# Patient Record
Sex: Male | Born: 1965 | Race: White | Hispanic: No | Marital: Married | Smoking: Never smoker
Health system: Southern US, Community
[De-identification: ages and names within clinical notes are randomized; demographics above are authoritative.]

## PROBLEM LIST (undated history)

## (undated) DIAGNOSIS — Z9109 Other allergy status, other than to drugs and biological substances: Secondary | ICD-10-CM

## (undated) HISTORY — PX: TONSILLECTOMY: SUR1361

## (undated) HISTORY — PX: HERNIA REPAIR: SHX51

## (undated) HISTORY — PX: SPLENECTOMY, TOTAL: SHX788

---

## 2011-04-14 ENCOUNTER — Emergency Department
Admission: EM | Admit: 2011-04-14 | Discharge: 2011-04-14 | Disposition: A | Discharge status: Home / Self Care | Payer: BC Managed Care – PPO | Source: Home / Self Care

## 2011-04-14 DIAGNOSIS — J45909 Unspecified asthma, uncomplicated: Secondary | ICD-10-CM

## 2011-04-14 DIAGNOSIS — R05 Cough: Secondary | ICD-10-CM

## 2011-04-14 DIAGNOSIS — R062 Wheezing: Secondary | ICD-10-CM

## 2011-04-14 DIAGNOSIS — R059 Cough, unspecified: Secondary | ICD-10-CM

## 2011-04-14 HISTORY — DX: Other allergy status, other than to drugs and biological substances: Z91.09

## 2011-04-14 MED ORDER — METHYLPREDNISOLONE ACETATE PF 80 MG/ML IJ SUSP
80.0000 mg | Freq: Once | INTRAMUSCULAR | Status: AC
  Administered 2011-04-14: 80 mg via INTRAMUSCULAR

## 2011-04-14 MED ORDER — PREDNISONE 50 MG PO TABS: ORAL_TABLET | ORAL | Status: AC

## 2011-04-14 MED ORDER — IPRATROPIUM-ALBUTEROL 0.5-2.5 (3) MG/3ML IN SOLN
3.0000 mL | Freq: Once | RESPIRATORY_TRACT | Status: AC
  Administered 2011-04-14: 3 mL via RESPIRATORY_TRACT

## 2011-04-14 MED ORDER — FAMOTIDINE 20 MG PO TABS: 20.0000 mg | ORAL_TABLET | Freq: Two times a day (BID) | ORAL | Status: DC

## 2011-04-14 MED ORDER — ALBUTEROL SULFATE HFA 108 (90 BASE) MCG/ACT IN AERS: 2.0000 | INHALATION_SPRAY | Freq: Four times a day (QID) | RESPIRATORY_TRACT | Status: DC | PRN

## 2011-04-14 NOTE — Discharge Instructions (Signed)
Asthma, Adult Asthma is caused by narrowing of the air passages in the lungs. It may be triggered by pollen, dust, animal dander, molds, some foods, respiratory infections, exposure to smoke, exercise, emotional stress or other allergens (things that cause allergic reactions or allergies). Repeat attacks are common. HOME CARE INSTRUCTIONS   Use prescription medications as ordered by your caregiver.   Avoid pollen, dust, animal dander, molds, smoke and other things that cause attacks at home and at work.   You may have fewer attacks if you decrease dust in your home. Electrostatic air cleaners may help.   It may help to replace your pillows or mattress with materials less likely to cause allergies.   Talk to your caregiver about an action plan for managing asthma attacks at home, including, the use of a peak flow meter which measures the severity of your asthma attack. An action plan can help minimize or stop the attack without having to seek medical care.   If you are not on a fluid restriction, drink 8 to 10 glasses of water each day.   Always have a plan prepared for seeking medical attention, including, calling your physician, accessing local emergency care, and calling 911 (in the [place] for a severe attack.   Discuss possible exercise routines with your caregiver.   If animal dander is the cause of asthma, you may need to get rid of pets.  SEEK MEDICAL CARE IF:   You have wheezing and shortness of breath even if taking medicine to prevent attacks.   You have muscle aches, chest pain or thickening of sputum.   Your sputum changes from clear or white to yellow, green, gray, or bloody.   You have any problems that may be related to the medicine you are taking (such as a rash, itching, swelling or trouble breathing).  SEEK IMMEDIATE MEDICAL CARE IF:   Your usual medicines do not stop your wheezing or there is increased coughing and/or shortness of breath.   You have increased  difficulty breathing.   You have a fever.  MAKE SURE YOU:   Understand these instructions.   Will watch your condition.   Will get help right away if you are not doing well or get worse.  Document Released: 01/22/2005 Document Revised: 01/11/2011 Document Reviewed: 09/10/2007 [facility] Patient Information 2012 [place], [place].  Allergies, Generic Allergies may happen from anything your body is sensitive to. This may be food, medicines, pollens, chemicals, and nearly anything around you in everyday life that produces allergens. An allergen is anything that causes an allergy producing substance. Heredity is often a factor in causing these problems. This means you may have some of the same allergies as your parents. Food allergies happen in all age groups. Food allergies are some of the most severe and life threatening. Some common food allergies are cow's milk, seafood, eggs, nuts, wheat, and soybeans. SYMPTOMS   Swelling around the mouth.   An itchy red rash or hives.   Vomiting or diarrhea.   Difficulty breathing.  SEVERE ALLERGIC REACTIONS ARE LIFE-THREATENING. This reaction is called anaphylaxis. It can cause the mouth and throat to swell and cause difficulty with breathing and swallowing. In severe reactions only a trace amount of food (for example, peanut oil in a salad) may cause death within seconds. Seasonal allergies occur in all age groups. These are seasonal because they usually occur during the same season every year. They may be a reaction to molds, grass pollens, or tree pollens. Other causes  of problems are house dust mite allergens, pet dander, and mold spores. The symptoms often consist of nasal congestion, a runny itchy nose associated with sneezing, and tearing itchy eyes. There is often an associated itching of the mouth and ears. The problems happen when you come in contact with pollens and other allergens. Allergens are the particles in the air that the body reacts to  with an allergic reaction. This causes you to release allergic antibodies. Through a chain of events, these eventually cause you to release histamine into the blood stream. Although it is meant to be protective to the body, it is this release that causes your discomfort. This is why you were given anti-histamines to feel better. If you are unable to pinpoint the offending allergen, it may be determined by skin or blood testing. Allergies cannot be cured but can be controlled with medicine. Hay fever is a collection of all or some of the seasonal allergy problems. It may often be treated with simple over-the-counter medicine such as diphenhydramine. Take medicine as directed. Do not drink alcohol or drive while taking this medicine. Check with your caregiver or package insert for child dosages. If these medicines are not effective, there are many new medicines your caregiver can prescribe. Stronger medicine such as nasal spray, eye drops, and corticosteroids may be used if the first things you try do not work well. Other treatments such as immunotherapy or desensitizing injections can be used if all else fails. Follow up with your caregiver if problems continue. These seasonal allergies are usually not life threatening. They are generally more of a nuisance that can often be handled using medicine. HOME CARE INSTRUCTIONS   If unsure what causes a reaction, keep a diary of foods eaten and symptoms that follow. Avoid foods that cause reactions.   If hives or rash are present:   Take medicine as directed.   You may use an over-the-counter antihistamine (diphenhydramine) for hives and itching as needed.   Apply cold compresses (cloths) to the skin or take baths in cool water. Avoid hot baths or showers. Heat will make a rash and itching worse.   If you are severely allergic:   Following a treatment for a severe reaction, hospitalization is often required for closer follow-up.   Wear a medic-alert  bracelet or necklace stating the allergy.   You and your family must learn how to give adrenaline or use an anaphylaxis kit.   If you have had a severe reaction, always carry your anaphylaxis kit or EpiPen with you. Use this medicine as directed by your caregiver if a severe reaction is occurring. Failure to do so could have a fatal outcome.  SEEK MEDICAL CARE IF:  You suspect a food allergy. Symptoms generally happen within 30 minutes of eating a food.   Your symptoms have not gone away within 2 days or are getting worse.   You develop new symptoms.   You want to retest yourself or your child with a food or drink you think causes an allergic reaction. Never do this if an anaphylactic reaction to that food or drink has happened before. Only do this under the care of a caregiver.  SEEK IMMEDIATE MEDICAL CARE IF:   You have difficulty breathing, are wheezing, or have a tight feeling in your chest or throat.   You have a swollen mouth, or you have hives, swelling, or itching all over your body.   You have had a severe reaction that has responded to  your anaphylaxis kit or an EpiPen. These reactions may return when the medicine has worn off. These reactions should be considered life threatening.  MAKE SURE YOU:   Understand these instructions.   Will watch your condition.   Will get help right away if you are not doing well or get worse.  Document Released: 04/17/2002 Document Revised: 01/11/2011 Document Reviewed: 09/22/2007 [facility] Patient Information 2012 [place], [place].

## 2011-04-14 NOTE — ED Provider Notes (Signed)
Subjective:    Patient ID: Don Dominguez is a 46 y.o. male.  Chief Complaint:  HPI Comments: Pt presents today with chief complaint of URI sxs including rhinorrhea, nasal congestion, cough x 5 days. Pt with a baseline history perennial allergies that he takes zyrtec and allergy shots for. Pt also with background history of mild intermittent asthma. Pt states that his allergic sxs as well as asthma sxs have worsened over this same time period. Pt reports mild increase in wheezing over this time period, though no significant increased WOB. Baseline rhinorrhea has been poorly controlled with zyrtec this week. Pt is unsure of sick contacts. Pt states that allergic and asthma sxs worsen this time of year. No fevers, nausea, vomiting. No diarrhea. + Mild headache.   No past medical history on file.  No past surgical history on file.  No family history on file.  History  Substance Use Topics  . Smoking status: Not on file  . Smokeless tobacco: Not on file  . Alcohol Use: Not on file    Review of Systems  Constitutional: Positive for fatigue. Negative for fever, chills and diaphoresis.  HENT: Positive for congestion, rhinorrhea and postnasal drip.   Respiratory: Positive for wheezing. Negative for chest tightness and shortness of breath.   Cardiovascular: Negative for chest pain.  Hematological: Negative for adenopathy.    Objective:   There were no vitals taken for this visit.  Physical Exam  Constitutional: He is oriented to person, place, and time. He appears well-developed and well-nourished.  HENT:  Head: Normocephalic and atraumatic.       +nasal erythema, rhinorrhea bilaterally, + post oropharyngeal erythema    Neck: Normal range of motion. Neck supple.  Cardiovascular: Normal rate and regular rhythm.   Pulmonary/Chest: Effort normal. He has wheezes.       + wheezes in apices bilaterally. Able to speak in full sentences.   Abdominal: Soft. Bowel sounds are normal.    Musculoskeletal: Normal range of motion.  Neurological: He is alert and oriented to person, place, and time.  Skin: Skin is warm. No rash noted. No erythema.    Assessment:   Procedures  There were no encounter diagnoses.  Plan:  Exam consistent with viral induced asthma exacerbation with overlapping allergic flare. Duoneb tx x 1. Depo medrol 80 IM x1. Albuterol inhaler refilled (pt ran out of rx). Prednisone 50 x 4 days (total 5 days of steroids). Pepcid with zyrtec for concominant antihistamine effect. Follow up with PCP next week. Respiratory red flags discussed at length.

## 2011-04-14 NOTE — ED Notes (Signed)
Cough x 5 days

## 2011-04-14 NOTE — ED Provider Notes (Signed)
Pt seen by Dr. Doroteo Bradford, MD 04/14/11 [phone]

## 2011-09-12 ENCOUNTER — Emergency Department
Admission: EM | Admit: 2011-09-12 | Discharge: 2011-09-12 | Disposition: A | Discharge status: Home / Self Care | Payer: BC Managed Care – PPO | Source: Home / Self Care | Attending: Family Medicine | Admitting: Family Medicine

## 2011-09-12 DIAGNOSIS — L309 Dermatitis, unspecified: Secondary | ICD-10-CM

## 2011-09-12 DIAGNOSIS — J45901 Unspecified asthma with (acute) exacerbation: Secondary | ICD-10-CM

## 2011-09-12 DIAGNOSIS — L259 Unspecified contact dermatitis, unspecified cause: Secondary | ICD-10-CM

## 2011-09-12 MED ORDER — GUAIFENESIN-CODEINE 100-10 MG/5ML PO SYRP: 10.0000 mL | ORAL_SOLUTION | Freq: Every day | ORAL | Status: AC

## 2011-09-12 MED ORDER — CEPHALEXIN 500 MG PO CAPS: 500.0000 mg | ORAL_CAPSULE | Freq: Two times a day (BID) | ORAL | Status: AC

## 2011-09-12 MED ORDER — TRIAMCINOLONE ACETONIDE 40 MG/ML IJ SUSP
40.0000 mg | Freq: Once | INTRAMUSCULAR | Status: AC
  Administered 2011-09-12: 40 mg via INTRAMUSCULAR

## 2011-09-12 MED ORDER — ALBUTEROL SULFATE HFA 108 (90 BASE) MCG/ACT IN AERS: 2.0000 | INHALATION_SPRAY | Freq: Four times a day (QID) | RESPIRATORY_TRACT | Status: DC | PRN

## 2011-09-12 NOTE — ED Provider Notes (Signed)
History     CSN: [id number]  Arrival date & time 09/12/11  1737   First MD Initiated Contact with Patient 09/12/11 1822      Chief Complaint  Patient presents with  . Rash    x 5 days  . Cough    dry cough x 5 days     HPI Comments: Patient presents with two problems: 1) 5 days ago after working out in yard, he developed a rash on his right forehead.  No pain, swelling, or drainage.  He believes that he may have contacted poison ivy. 2) 5 days ago he also developed a productive cough with occasional wheezing.  He has increased the use of his albuterol inhaler.  He has had increased sinus congestion and mild sore throat.  No fevers, chills, and sweats.  No shortness of breath or pleuritic pain.  His cough has been somewhat worse at night, but he sleeps well with a bedtime dose of Cheratussin.  The history is provided by the patient and the spouse.    Past Medical History  Diagnosis Date  . Asthma   . Environmental allergies     History reviewed. No pertinent past surgical history.  Family History  Problem Relation Age of Onset  . Cancer Other     lung  . Cancer Other     lung  . Heart attack Other     History  Substance Use Topics  . Smoking status: Never Smoker   . Smokeless tobacco: Not on file  . Alcohol Use: No      Review of Systems + sore throat + cough No pleuritic pain + wheezing + nasal congestion + post-nasal drainage No sinus pain/pressure No itchy/red eyes No earache No hemoptysis No SOB No fever/chills No nausea No vomiting No abdominal pain No diarrhea No urinary symptoms + skin rash on face + fatigue No myalgias No headache   Allergies  Review of patient's allergies indicates no known allergies.  Home Medications   Current Outpatient Rx  Name Route Sig Dispense Refill  . ALBUTEROL SULFATE HFA 108 (90 BASE) MCG/ACT IN AERS Inhalation Inhale 2 puffs into the lungs every 6 (six) hours as needed for wheezing. 1 Inhaler 2  .  CETIRIZINE HCL 10 MG PO TABS Oral Take 10 mg by mouth daily.    . ALBUTEROL SULFATE HFA 108 (90 BASE) MCG/ACT IN AERS Inhalation Inhale 2 puffs into the lungs every 6 (six) hours as needed for wheezing. 1 Inhaler 0  . CEPHALEXIN 500 MG PO CAPS Oral Take 1 capsule (500 mg total) by mouth 2 (two) times daily. 14 capsule 0  . FAMOTIDINE 20 MG PO TABS Oral Take 1 tablet (20 mg total) by mouth 2 (two) times daily. 60 tablet 2  . GUAIFENESIN-CODEINE 100-10 MG/5ML PO SYRP Oral Take 10 mLs by mouth at bedtime. for cough 120 mL 0    BP 123/82  Pulse 67  Temp 98.1 F (36.7 C) (Oral)  Resp 18  Ht 5\' 10"  (1.778 m)  Wt 171 lb (77.565 kg)  BMI 24.54 kg/m2  SpO2 98%  Physical Exam  HENT:  Head: Atraumatic.         There is a crusted erythematous eruption on right face as noted on diagram.  No swelling, tenderness or drainage.  Nursing notes and Vital Signs reviewed. Appearance:  Patient appears healthy, stated age, and in no acute distress Eyes:  Pupils are equal, round, and reactive to light and accomodation.  Extraocular  movement is intact.  Conjunctivae are not inflamed  Ears:  Canals normal.  Tympanic membranes normal.  Nose:  Mildly congested turbinates.  No sinus tenderness.   Pharynx:  Normal Neck:  Supple.  Slightly tender shotty posterior nodes are palpated bilaterally  Lungs:  Clear to auscultation.  Breath sounds are equal.  Heart:  Regular rate and rhythm without murmurs, rubs, or gallops.  Abdomen:  Nontender without masses or hepatosplenomegaly.  Bowel sounds are present.  No CVA or flank tenderness.  Extremities:  No edema.  No calf tenderness Skin:  No rash present.    ED Course  Procedures none      1. Dermatitis of face.  Probably rhus contact dermatitis, although Herpes Zoster a possibility   2. Asthma flare; possible early viral URI      MDM  Kenalog 40mg  IM.  May continue Caladryl lotion. Begin empiric Keflex to cover possible secondary bacterial infection  face. Take Mucinex D (guaifenesin with decongestant) twice daily for congestion.  Increase fluid intake, rest. May use Afrin nasal spray (or generic oxymetazoline) twice daily for about 5 days.  Also recommend using saline nasal spray several times daily and saline nasal irrigation (AYR is a common brand) Stop all antihistamines for now, and other non-prescription cough/cold preparations. Continue albuterol inhaler as needed Follow-up with family doctor if not improving 7 to 10 days.         Lattie Haw, MD 09/12/11 [phone]

## 2011-09-12 NOTE — ED Notes (Signed)
Don Dominguez complains of rash on his forehead for  5 days. Denies fever, chills or sweats. He also complains of a productive cough with yellow sputum for 1 week.

## 2012-02-06 ENCOUNTER — Emergency Department
Admission: EM | Admit: 2012-02-06 | Discharge: 2012-02-06 | Disposition: A | Discharge status: Home / Self Care | Payer: BC Managed Care – PPO | Source: Home / Self Care | Attending: Family Medicine | Admitting: Family Medicine

## 2012-02-06 ENCOUNTER — Encounter: Payer: Self-pay | Admitting: *Deleted

## 2012-02-06 DIAGNOSIS — J45909 Unspecified asthma, uncomplicated: Secondary | ICD-10-CM

## 2012-02-06 MED ORDER — AMOXICILLIN 875 MG PO TABS: 875.0000 mg | ORAL_TABLET | Freq: Two times a day (BID) | ORAL | Status: DC

## 2012-02-06 MED ORDER — IPRATROPIUM-ALBUTEROL 0.5-2.5 (3) MG/3ML IN SOLN
3.0000 mL | Freq: Once | RESPIRATORY_TRACT | Status: AC
  Administered 2012-02-06: 3 mL via RESPIRATORY_TRACT

## 2012-02-06 MED ORDER — PREDNISONE 20 MG PO TABS: 20.0000 mg | ORAL_TABLET | Freq: Two times a day (BID) | ORAL | Status: DC

## 2012-02-06 MED ORDER — BENZONATATE 200 MG PO CAPS: 200.0000 mg | ORAL_CAPSULE | Freq: Every day | ORAL | Status: DC

## 2012-02-06 NOTE — ED Notes (Signed)
Pt c/o cough, wheezing, fatigue and body aches x 01/31/12, worse x 3 days. He reports having a temp of 99.5 x 2 days. He has taken OTC cough med and used vick's vapor rub.

## 2012-02-06 NOTE — ED Provider Notes (Signed)
History     CSN: [id number]  Arrival date & time 02/06/12  1723   First MD Initiated Contact with Patient 02/06/12 1748      Chief Complaint  Patient presents with  . Cough  . Fatigue  . Generalized Body Aches  . Wheezing     HPI Comments: Patient complains of approximately one week history of gradually progressive URI symptoms beginning with a mild sore throat (now improved), followed by nasal congestion and a cough.  Complains of fatigue but no myalgias.  Cough is now worse at night and generally non-productive during the day.  There has been no pleuritic pain or shortness of breath.  Over the past 3 days he has developed increased wheezing, cough, and sweats.  The history is provided by the patient.    Past Medical History  Diagnosis Date  . Asthma   . Environmental allergies     Past Surgical History  Procedure Date  . Tonsillectomy   . Hernia repair     Family History  Problem Relation Age of Onset  . Cancer Other     lung  . Cancer Other     lung  . Heart attack Other   . Diabetes Mother   . Heart attack Father     History  Substance Use Topics  . Smoking status: Never Smoker   . Smokeless tobacco: Not on file  . Alcohol Use: No      Review of Systems + sore throat + cough No pleuritic pain + wheezing + nasal congestion + post-nasal drainage No sinus pain/pressure No itchy/red eyes No earache No hemoptysis No SOB No fever, + chills/sweats + nausea No vomiting No abdominal pain No diarrhea No urinary symptoms No skin rashes + fatigue No myalgias No headache   Allergies  Review of patient's allergies indicates no known allergies.  Home Medications   Current Outpatient Rx  Name  Route  Sig  Dispense  Refill  . ALBUTEROL SULFATE HFA 108 (90 BASE) MCG/ACT IN AERS   Inhalation   Inhale 2 puffs into the lungs every 6 (six) hours as needed for wheezing.   1 Inhaler   2   . ALBUTEROL SULFATE HFA 108 (90 BASE) MCG/ACT IN AERS  Inhalation   Inhale 2 puffs into the lungs every 6 (six) hours as needed for wheezing.   1 Inhaler   0   . AMOXICILLIN 875 MG PO TABS   Oral   Take 1 tablet (875 mg total) by mouth 2 (two) times daily.   20 tablet   0   . BENZONATATE 200 MG PO CAPS   Oral   Take 1 capsule (200 mg total) by mouth at bedtime. Take as needed for cough   12 capsule   0   . CETIRIZINE HCL 10 MG PO TABS   Oral   Take 10 mg by mouth daily.         Marland Kitchen FAMOTIDINE 20 MG PO TABS   Oral   Take 1 tablet (20 mg total) by mouth 2 (two) times daily.   60 tablet   2   . PREDNISONE 20 MG PO TABS   Oral   Take 1 tablet (20 mg total) by mouth 2 (two) times daily.   10 tablet   0     BP 131/79  Pulse 88  Temp 98.2 F (36.8 C) (Oral)  Resp 18  Ht 5\' 10"  (1.778 m)  Wt 172 lb (78.019 kg)  BMI  24.68 kg/m2  SpO2 97%  Physical Exam Nursing notes and Vital Signs reviewed. Appearance:  Patient appears stated age, and in no acute distress Eyes:  Pupils are equal, round, and reactive to light and accomodation.  Extraocular movement is intact.  Conjunctivae are not inflamed  Ears:  Canals normal.  Tympanic membranes normal.  Nose:  Mildly congested turbinates.  No sinus tenderness.  Pharynx:  Normal Neck:  Supple. No adenopathy Lungs:   Faint scattered wheezes posteriorly without rales or rhonchi.  Breath sounds are equal.  Heart:  Regular rate and rhythm without murmurs, rubs, or gallops.  Abdomen:  Nontender without masses or hepatosplenomegaly.  Bowel sounds are present.  No CVA or flank tenderness.  Extremities:  No edema.  No calf tenderness Skin:  No rash present.   ED Course  Procedures none      1. Asthmatic bronchitis       MDM  Administered DuoNeb by nebulizer Prednisone burst.  Begin amoxicillin for 10 days.  Prescription written for Benzonatate [facility]) to take at bedtime for night-time cough.  Take Mucinex D (guaifenesin with decongestant) twice daily for congestion.  Increase  fluid intake, rest. May use Afrin nasal spray (or generic oxymetazoline) twice daily for about 5 days.  Also recommend using saline nasal spray several times daily and saline nasal irrigation (AYR is a common brand) Stop all antihistamines for now, and other non-prescription cough/cold preparations. Continue albuterol inhaler and Advair inhaler  Follow-up with family doctor if not improving one week        Lattie Haw, MD 02/09/12 1200

## 2012-02-07 ENCOUNTER — Telehealth: Payer: Self-pay | Admitting: *Deleted

## 2012-02-10 ENCOUNTER — Telehealth: Payer: Self-pay

## 2012-02-10 NOTE — ED Notes (Signed)
I called and spoke with patient's wife and he is doing better. I advised to call back if anything changes or if he has questions or concerns.  

## 2012-02-15 ENCOUNTER — Emergency Department (INDEPENDENT_AMBULATORY_CARE_PROVIDER_SITE_OTHER): Payer: BC Managed Care – PPO

## 2012-02-15 ENCOUNTER — Encounter: Payer: Self-pay | Admitting: Emergency Medicine

## 2012-02-15 ENCOUNTER — Emergency Department
Admission: EM | Admit: 2012-02-15 | Discharge: 2012-02-15 | Disposition: A | Discharge status: Home / Self Care | Payer: BC Managed Care – PPO | Source: Home / Self Care | Attending: Family Medicine | Admitting: Family Medicine

## 2012-02-15 DIAGNOSIS — R059 Cough, unspecified: Secondary | ICD-10-CM

## 2012-02-15 DIAGNOSIS — R05 Cough: Secondary | ICD-10-CM

## 2012-02-15 DIAGNOSIS — J45909 Unspecified asthma, uncomplicated: Secondary | ICD-10-CM

## 2012-02-15 MED ORDER — METHYLPREDNISOLONE ACETATE 80 MG/ML IJ SUSP
80.0000 mg | Freq: Once | INTRAMUSCULAR | Status: AC
  Administered 2012-02-15: 80 mg via INTRAMUSCULAR

## 2012-02-15 MED ORDER — CLARITHROMYCIN 500 MG PO TABS: 500.0000 mg | ORAL_TABLET | Freq: Two times a day (BID) | ORAL | Status: DC

## 2012-02-15 NOTE — ED Notes (Signed)
Productive cough x 3 weeks, tan mucus, fever

## 2012-02-15 NOTE — ED Provider Notes (Signed)
History     CSN: [id number]  Arrival date & time 02/15/12  1478   First MD Initiated Contact with Patient 02/15/12 2013      Chief Complaint  Patient presents with  . Cough      HPI Comments: Patient was evaluated approximately 9 days ago for URI symptoms.  He has just completed a course of amoxicillin and prednisone burst, but complains of persistent partly productive cough and increased fatigue.  Over the past 3 to 4 days he has experienced sweats.  He continues to wheeze at times.  He notes that he has been using his Advair more than twice daily.  The history is provided by the patient.    Past Medical History  Diagnosis Date  . Asthma   . Environmental allergies     Past Surgical History  Procedure Date  . Tonsillectomy   . Hernia repair     Family History  Problem Relation Age of Onset  . Cancer Other     lung  . Cancer Other     lung  . Heart attack Other   . Diabetes Mother   . Heart attack Father     History  Substance Use Topics  . Smoking status: Never Smoker   . Smokeless tobacco: Not on file  . Alcohol Use: No      Review of Systems + sore throat + cough No pleuritic pain + wheezing + nasal congestion + post-nasal drainage No sinus pain/pressure No itchy/red eyes No earache No hemoptysis No SOB No fever, + chills/sweats No nausea No vomiting No abdominal pain No diarrhea No urinary symptoms No skin rashes + fatigue No myalgias No headache   Allergies  Review of patient's allergies indicates not on file.  Home Medications   Current Outpatient Rx  Name  Route  Sig  Dispense  Refill  . ALBUTEROL SULFATE HFA 108 (90 BASE) MCG/ACT IN AERS   Inhalation   Inhale 2 puffs into the lungs every 6 (six) hours as needed for wheezing.   1 Inhaler   2   . ALBUTEROL SULFATE HFA 108 (90 BASE) MCG/ACT IN AERS   Inhalation   Inhale 2 puffs into the lungs every 6 (six) hours as needed for wheezing.   1 Inhaler   0   . AMOXICILLIN  875 MG PO TABS   Oral   Take 1 tablet (875 mg total) by mouth 2 (two) times daily.   20 tablet   0   . BENZONATATE 200 MG PO CAPS   Oral   Take 1 capsule (200 mg total) by mouth at bedtime. Take as needed for cough   12 capsule   0   . CETIRIZINE HCL 10 MG PO TABS   Oral   Take 10 mg by mouth daily.         Marland Kitchen CLARITHROMYCIN 500 MG PO TABS   Oral   Take 1 tablet (500 mg total) by mouth 2 (two) times daily.   14 tablet   0   . FAMOTIDINE 20 MG PO TABS   Oral   Take 1 tablet (20 mg total) by mouth 2 (two) times daily.   60 tablet   2   . PREDNISONE 20 MG PO TABS   Oral   Take 1 tablet (20 mg total) by mouth 2 (two) times daily.   10 tablet   0     BP 96/66  Pulse 74  Temp 98.2 F (36.8 C) (Oral)  Resp 18  Ht 5\' 10"  (1.778 m)  Wt 172 lb (78.019 kg)  BMI 24.68 kg/m2  SpO2 99%  Physical Exam Nursing notes and Vital Signs reviewed. Appearance:  Patient appears healthy, stated age, and in no acute distress Eyes:  Pupils are equal, round, and reactive to light and accomodation.  Extraocular movement is intact.  Conjunctivae are not inflamed  Ears:  Canals normal.  Tympanic membranes normal.  Nose:  Mildly congested turbinates.  No sinus tenderness.  Pharynx:  Normal Neck:  Supple.  No adenopathy Lungs:   Minimal faint bibasilar wheezes; no rales or rhonchi.  Breath sounds are equal.  Heart:  Regular rate and rhythm without murmurs, rubs, or gallops.  Abdomen:  Nontender without masses or hepatosplenomegaly.  Bowel sounds are present.  No CVA or flank tenderness.  Extremities:  No edema.  No calf tenderness Skin:  No rash present.   ED Course  Procedures none   Dg Chest 2 View  02/15/2012  *RADIOLOGY REPORT*  Clinical Data: Productive cough  CHEST - 2 VIEW  Comparison: None.  Findings:  Normal mediastinum and cardiac silhouette.  Normal pulmonary  vasculature.  No evidence of effusion, infiltrate, or pneumothorax.  No acute bony abnormality. Degenerative  osteophytosis of the thoracic spine.  IMPRESSION: No acute cardiopulmonary process.   Original Report Authenticated By: Genevive Bi, M.D.      1. Asthmatic bronchitis       MDM  DepoMedrol 80mg  IM.  Begin Biaxin to cover atypical agents. Take Mucinex D (guaifenesin with decongestant) twice daily for congestion.  Increase fluid intake, rest. May use Afrin nasal spray (or generic oxymetazoline) twice daily for about 5 days.  Also recommend using saline nasal spray several times daily and saline nasal irrigation (AYR is a common brand) Stop all antihistamines for now, and other non-prescription cough/cold preparations. Use Advair only twice per day (morning and evening).  Continue albuterol inhaler as needed. Recommend a Tdap when well.  Follow-up with family doctor in about one week.         Lattie Haw, MD 02/19/12 [phone]

## 2012-03-29 ENCOUNTER — Other Ambulatory Visit: Payer: Self-pay | Admitting: Family Medicine

## 2012-05-15 ENCOUNTER — Encounter: Payer: Self-pay | Admitting: Emergency Medicine

## 2012-05-15 ENCOUNTER — Emergency Department
Admission: EM | Admit: 2012-05-15 | Discharge: 2012-05-15 | Disposition: A | Discharge status: Home / Self Care | Payer: BC Managed Care – PPO | Source: Home / Self Care | Attending: Family Medicine | Admitting: Family Medicine

## 2012-05-15 DIAGNOSIS — L237 Allergic contact dermatitis due to plants, except food: Secondary | ICD-10-CM

## 2012-05-15 DIAGNOSIS — L255 Unspecified contact dermatitis due to plants, except food: Secondary | ICD-10-CM

## 2012-05-15 MED ORDER — PREDNISONE 50 MG PO TABS: ORAL_TABLET | ORAL | Status: DC

## 2012-05-15 MED ORDER — METHYLPREDNISOLONE SODIUM SUCC 125 MG IJ SOLR
125.0000 mg | Freq: Once | INTRAMUSCULAR | Status: AC
  Administered 2012-05-15: 125 mg via INTRAMUSCULAR

## 2012-05-15 NOTE — ED Notes (Signed)
Rash on left arm and right face x 5 days

## 2012-05-15 NOTE — ED Provider Notes (Signed)
History     CSN: [id number]  Arrival date & time 05/15/12  1722   First MD Initiated Contact with Patient 05/15/12 1736      Chief Complaint  Patient presents with  . Rash    HPI  HPI  This patient complains of a RASH  Location: L arm, R cheek   Onset: 3-4 days   Course: has had progressive redness and itching. Known poison ivy exposure.   Self-treated with: nothing  Improvement with treatment: n/a  History  Itching: yes  Tenderness: no  New medications/antibiotics: no  Pet exposure: no  Recent travel or tropical exposure: no  New soaps, shampoos, detergent, clothing: no  Tick/insect exposure: no  Chemical Exposure: no  Red Flags  Feeling ill: no  Fever: no  Facial/tongue swelling/difficulty breathing: no  Diabetic or immunocompromised: no    Past Medical History  Diagnosis Date  . Asthma   . Environmental allergies     Past Surgical History  Procedure Laterality Date  . Tonsillectomy    . Hernia repair      Family History  Problem Relation Age of Onset  . Cancer Other     lung  . Cancer Other     lung  . Heart attack Other   . Diabetes Mother   . Heart attack Father     History  Substance Use Topics  . Smoking status: Never Smoker   . Smokeless tobacco: Not on file  . Alcohol Use: No      Review of Systems  All other systems reviewed and are negative.    Allergies  Review of patient's allergies indicates no known allergies.  Home Medications   Current Outpatient Rx  Name  Route  Sig  Dispense  Refill  . EXPIRED: albuterol (PROVENTIL HFA;VENTOLIN HFA) 108 (90 BASE) MCG/ACT inhaler   Inhalation   Inhale 2 puffs into the lungs every 6 (six) hours as needed for wheezing.   1 Inhaler   2   . albuterol (PROVENTIL HFA;VENTOLIN HFA) 108 (90 BASE) MCG/ACT inhaler   Inhalation   Inhale 2 puffs into the lungs every 6 (six) hours as needed for wheezing.   1 Inhaler   0   . amoxicillin (AMOXIL) 875 MG tablet   Oral   Take 1 tablet  (875 mg total) by mouth 2 (two) times daily.   20 tablet   0   . benzonatate (TESSALON) 200 MG capsule   Oral   Take 1 capsule (200 mg total) by mouth at bedtime. Take as needed for cough   12 capsule   0   . cetirizine (ZYRTEC) 10 MG tablet   Oral   Take 10 mg by mouth daily.         . clarithromycin (BIAXIN) 500 MG tablet   Oral   Take 1 tablet (500 mg total) by mouth 2 (two) times daily.   14 tablet   0   . predniSONE (DELTASONE) 20 MG tablet   Oral   Take 1 tablet (20 mg total) by mouth 2 (two) times daily.   10 tablet   0     BP 132/78  Pulse 86  Temp(Src) 98 F (36.7 C) (Oral)  Ht 5\' 11"  (1.803 m)  Wt 170 lb (77.111 kg)  BMI 23.72 kg/m2  SpO2 96%  Physical Exam  Constitutional: He appears well-developed and well-nourished.  HENT:  Head: Normocephalic and atraumatic.  Eyes: Conjunctivae are normal. Pupils are equal, round, and reactive to  light.  Neck: Normal range of motion.  Cardiovascular: Normal rate and regular rhythm.   Pulmonary/Chest: Effort normal and breath sounds normal.  Abdominal: Soft.  Musculoskeletal: Normal range of motion.  Neurological: He is alert.  Skin: Rash noted.      ED Course  Procedures   Labs Reviewed - No data to display No results found.   1. Poison ivy       MDM  Fairly mild case of poison ivy. Will place patient on course of prednisone x5 days. Solu-Medrol 125 mg IM x1 here today. When necessary Benadryl for itching. Followup as needed. Handout given. Gen. infectious and dermatologic red flags reviewed.     The patient and/or caregiver has been counseled thoroughly with regard to treatment plan and/or medications prescribed including dosage, schedule, interactions, rationale for use, and possible side effects and they verbalize understanding. Diagnoses and expected course of recovery discussed and will return if not improved as expected or if the condition worsens. Patient and/or caregiver verbalized  understanding.             Doree Albee, MD 05/15/12 [phone]

## 2013-01-26 ENCOUNTER — Encounter: Payer: Self-pay | Admitting: Emergency Medicine

## 2013-01-26 ENCOUNTER — Emergency Department
Admission: EM | Admit: 2013-01-26 | Discharge: 2013-01-26 | Disposition: A | Discharge status: Home / Self Care | Payer: BC Managed Care – PPO | Source: Home / Self Care | Attending: Family Medicine | Admitting: Family Medicine

## 2013-01-26 DIAGNOSIS — J069 Acute upper respiratory infection, unspecified: Secondary | ICD-10-CM

## 2013-01-26 MED ORDER — PREDNISONE 20 MG PO TABS: 20.0000 mg | ORAL_TABLET | Freq: Two times a day (BID) | ORAL | Status: DC

## 2013-01-26 NOTE — ED Notes (Signed)
Pt c/o cough, fatigue, body aches, and sore throat x 1 wk. Denies fever. He has taken tylenol and IBF prn.

## 2013-01-26 NOTE — ED Provider Notes (Signed)
CSN: [id number]     Arrival date & time 01/26/13  1748 History   First MD Initiated Contact with Patient 01/26/13 1800     Chief Complaint  Patient presents with  . Cough  . Generalized Body Aches  . Fatigue      HPI Comments: Patient complains of onset of URI symptoms one week ago with initial mild sore throat and fatigue, followed by nasal congestion, myalgias, and mild cough.  He had low grade fever initially, now resolved.  He has occasional wheezing improved with his albuterol inhaler.  He has been using his Advair inhaler once daily.  He has had influenza immunization for this season.   The history is provided by the patient.    Past Medical History  Diagnosis Date  . Asthma   . Environmental allergies    Past Surgical History  Procedure Laterality Date  . Tonsillectomy    . Hernia repair    . Splenectomy, total     Family History  Problem Relation Age of Onset  . Cancer Other     lung  . Cancer Other     lung  . Heart attack Other   . Diabetes Mother   . Hypertension Mother   . Heart attack Father    History  Substance Use Topics  . Smoking status: Never Smoker   . Smokeless tobacco: Not on file  . Alcohol Use: No    Review of Systems + sore throat, resolved + cough No pleuritic pain Occasional wheezing + nasal congestion + post-nasal drainage No sinus pain/pressure No itchy/red eyes No earache No hemoptysis No SOB No fever/chills No nausea No vomiting No abdominal pain No diarrhea No urinary symptoms No skin rash + fatigue + myalgias + headache Used OTC meds without relief  Allergies  Review of patient's allergies indicates no known allergies.  Home Medications   Current Outpatient Rx  Name  Route  Sig  Dispense  Refill  . famotidine (PEPCID) 10 MG tablet   Oral   Take 10 mg by mouth 2 (two) times daily.         . Isosorbide Mononitrate (MONOKET PO)   Oral   Take by mouth.         . EXPIRED: albuterol (PROVENTIL  HFA;VENTOLIN HFA) 108 (90 BASE) MCG/ACT inhaler   Inhalation   Inhale 2 puffs into the lungs every 6 (six) hours as needed for wheezing.   1 Inhaler   0   . cetirizine (ZYRTEC) 10 MG tablet   Oral   Take 10 mg by mouth daily.         Marland Kitchen EXPIRED: famotidine (PEPCID) 20 MG tablet   Oral   Take 1 tablet (20 mg total) by mouth 2 (two) times daily.   60 tablet   2   . predniSONE (DELTASONE) 20 MG tablet   Oral   Take 1 tablet (20 mg total) by mouth 2 (two) times daily. Take with food.   10 tablet   0    BP 153/97  Pulse 79  Temp(Src) 98 F (36.7 C) (Oral)  Resp 18  Wt 168 lb (76.204 kg)  SpO2 97% Physical Exam Nursing notes and Vital Signs reviewed. Appearance:  Patient appears healthy, stated age, and in no acute distress Eyes:  Pupils are equal, round, and reactive to light and accomodation.  Extraocular movement is intact.  Conjunctivae are not inflamed  Ears:  Canals normal.  Tympanic membranes normal.  Nose:  Mildly congested  turbinates.  No sinus tenderness.    Pharynx:  Normal Neck:  Supple.   No adenopathy Lungs:  Clear to auscultation.  Breath sounds are equal.  Heart:  Regular rate and rhythm without murmurs, rubs, or gallops.  Abdomen:  Nontender without masses or hepatosplenomegaly.  Bowel sounds are present.  No CVA or flank tenderness.  Extremities:  No edema.  No calf tenderness Skin:  No rash present.   ED Course  Procedures  None    Labs Reviewed  POCT RAPID STREP A (OFFICE) negative         MDM   1. Acute upper respiratory infections of unspecified site; viral URI almost cleared    There is no evidence of bacterial infection today.   Begin prednisone burst. Take Mucinex D (1200mg  guaifenesin with decongestant) twice daily for congestion.  Increase fluid intake, rest. May use Afrin nasal spray (or generic oxymetazoline) twice daily for about 5 days.  Also recommend using saline nasal spray several times daily and saline nasal irrigation (AYR  is a common brand) Increase Advair to one inhalation twice daily until symptoms resolve, then resume once daily.  Continue albuterol inhaler as needed. Stop all antihistamines for now, and other non-prescription cough/cold preparations. Followup with Family Doctor if not improved in one week.       Lattie Haw, MD 01/26/13 [phone]

## 2013-01-30 ENCOUNTER — Telehealth: Payer: Self-pay | Admitting: Emergency Medicine

## 2013-02-01 ENCOUNTER — Emergency Department
Admission: EM | Admit: 2013-02-01 | Discharge: 2013-02-01 | Disposition: A | Discharge status: Home / Self Care | Payer: BC Managed Care – PPO | Source: Home / Self Care | Attending: Family Medicine | Admitting: Family Medicine

## 2013-02-01 ENCOUNTER — Encounter: Payer: Self-pay | Admitting: Emergency Medicine

## 2013-02-01 DIAGNOSIS — J069 Acute upper respiratory infection, unspecified: Secondary | ICD-10-CM

## 2013-02-01 LAB — POCT INFLUENZA A/B: Influenza A, POC: NEGATIVE

## 2013-02-01 LAB — POCT RAPID STREP A (OFFICE): Rapid Strep A Screen: NEGATIVE

## 2013-02-01 MED ORDER — AZITHROMYCIN 250 MG PO TABS: ORAL_TABLET | ORAL | Status: DC

## 2013-02-01 MED ORDER — GUAIFENESIN-CODEINE 100-10 MG/5ML PO SOLN: 5.0000 mL | Freq: Three times a day (TID) | ORAL | Status: DC | PRN

## 2013-02-01 MED ORDER — DOXYCYCLINE HYCLATE 100 MG PO CAPS: 100.0000 mg | ORAL_CAPSULE | Freq: Two times a day (BID) | ORAL | Status: AC

## 2013-02-01 NOTE — ED Notes (Signed)
Patient was here 01/26/2013 with same symptoms; unresolved.

## 2013-02-01 NOTE — ED Provider Notes (Signed)
CSN: [id number]     Arrival date & time 02/01/13  1643 History   First MD Initiated Contact with Patient 02/01/13 1647     Chief Complaint  Patient presents with  . Cough  . Nasal Congestion  . Sore Throat    HPI  URI Symptoms Onset: 7-10 days  Description: rhinorrhea, nasal congestion, cough, mild wheezing-improving  Modifying factors:  Was seen earlier in the week here at [facility]. Given course of prednisone.  Still with cough. ? Flu exposure. No body aches or fever   Symptoms Nasal discharge: yes Fever: no Sore throat: no Cough: yes Wheezing: yes-improving Ear pain: no GI symptoms: no Sick contacts: yes  Red Flags  Stiff neck: no Dyspnea: no Rash: no Swallowing difficulty: no  Sinusitis Risk Factors Headache/face pain: no Double sickening: no tooth pain: no  Allergy Risk Factors Sneezing: no Itchy scratchy throat: no Seasonal symptoms: no  Flu Risk Factors Headache: no muscle aches: no severe fatigue: no   Past Medical History  Diagnosis Date  . Asthma   . Environmental allergies    Past Surgical History  Procedure Laterality Date  . Tonsillectomy    . Hernia repair    . Splenectomy, total     Family History  Problem Relation Age of Onset  . Cancer Other     lung  . Cancer Other     lung  . Heart attack Other   . Diabetes Mother   . Hypertension Mother   . Heart attack Father    History  Substance Use Topics  . Smoking status: Never Smoker   . Smokeless tobacco: Not on file  . Alcohol Use: No    Review of Systems  All other systems reviewed and are negative.    Allergies  Review of patient's allergies indicates no known allergies.  Home Medications   Current Outpatient Rx  Name  Route  Sig  Dispense  Refill  . EXPIRED: albuterol (PROVENTIL HFA;VENTOLIN HFA) 108 (90 BASE) MCG/ACT inhaler   Inhalation   Inhale 2 puffs into the lungs every 6 (six) hours as needed for wheezing.   1 Inhaler   0   . cetirizine (ZYRTEC) 10 MG  tablet   Oral   Take 10 mg by mouth daily.         . famotidine (PEPCID) 10 MG tablet   Oral   Take 10 mg by mouth 2 (two) times daily.         Marland Kitchen EXPIRED: famotidine (PEPCID) 20 MG tablet   Oral   Take 1 tablet (20 mg total) by mouth 2 (two) times daily.   60 tablet   2   . Isosorbide Mononitrate (MONOKET PO)   Oral   Take by mouth.         . predniSONE (DELTASONE) 20 MG tablet   Oral   Take 1 tablet (20 mg total) by mouth 2 (two) times daily. Take with food.   10 tablet   0    BP 101/69  Pulse 77  Temp(Src) 98.1 F (36.7 C) (Oral)  Resp 16  Ht 5\' 11"  (1.803 m)  Wt 168 lb (76.204 kg)  BMI 23.44 kg/m2  SpO2 97% Physical Exam  Constitutional: He appears well-developed and well-nourished.  HENT:  Head: Normocephalic and atraumatic.  Right Ear: External ear normal.  Left Ear: External ear normal.  +nasal erythema, rhinorrhea bilaterally, + post oropharyngeal erythema    Eyes: Conjunctivae are normal. Pupils are equal, round, and reactive  to light.  Neck: Normal range of motion. Neck supple.  Cardiovascular: Normal rate, regular rhythm and normal heart sounds.   Pulmonary/Chest: Effort normal and breath sounds normal.  Abdominal: Soft.  Musculoskeletal: Normal range of motion.  Neurological: He is alert.  Skin: Skin is warm.    ED Course  Procedures (including critical care time) Labs Review Labs Reviewed - No data to display Imaging Review No results found.  EKG Interpretation    Date/Time:    Ventricular Rate:    PR Interval:    QRS Duration:   QT Interval:    QTC Calculation:   R Axis:     Text Interpretation:              MDM   1. URI (upper respiratory infection)    Likely viral process Rapid strep and flu negative  Doxy for lower resp coverage Robitussin ac Continue prednisone Discussed infectious and ENT/resp red flags at length Follow up as needed.     The patient and/or caregiver has been counseled thoroughly with  regard to treatment plan and/or medications prescribed including dosage, schedule, interactions, rationale for use, and possible side effects and they verbalize understanding. Diagnoses and expected course of recovery discussed and will return if not improved as expected or if the condition worsens. Patient and/or caregiver verbalized understanding.         Doree Albee, MD 02/01/13 [phone]

## 2013-02-06 ENCOUNTER — Telehealth: Payer: Self-pay | Admitting: *Deleted

## 2013-03-08 ENCOUNTER — Emergency Department
Admission: EM | Admit: 2013-03-08 | Discharge: 2013-03-08 | Disposition: A | Discharge status: Home / Self Care | Payer: BC Managed Care – PPO | Source: Home / Self Care | Attending: Family Medicine | Admitting: Family Medicine

## 2013-03-08 ENCOUNTER — Encounter: Payer: Self-pay | Admitting: Emergency Medicine

## 2013-03-08 DIAGNOSIS — L0100 Impetigo, unspecified: Secondary | ICD-10-CM

## 2013-03-08 DIAGNOSIS — J45909 Unspecified asthma, uncomplicated: Secondary | ICD-10-CM

## 2013-03-08 MED ORDER — PREDNISONE 20 MG PO TABS: 20.0000 mg | ORAL_TABLET | Freq: Two times a day (BID) | ORAL | Status: DC

## 2013-03-08 MED ORDER — FLUTICASONE-SALMETEROL 100-50 MCG/DOSE IN AEPB: 1.0000 | INHALATION_SPRAY | Freq: Two times a day (BID) | RESPIRATORY_TRACT | Status: DC

## 2013-03-08 MED ORDER — BENZONATATE 200 MG PO CAPS: 200.0000 mg | ORAL_CAPSULE | Freq: Every day | ORAL | Status: DC

## 2013-03-08 MED ORDER — CEFDINIR 300 MG PO CAPS: 300.0000 mg | ORAL_CAPSULE | Freq: Two times a day (BID) | ORAL | Status: DC

## 2013-03-08 MED ORDER — ALBUTEROL SULFATE HFA 108 (90 BASE) MCG/ACT IN AERS: 2.0000 | INHALATION_SPRAY | Freq: Four times a day (QID) | RESPIRATORY_TRACT | Status: DC | PRN

## 2013-03-08 NOTE — ED Provider Notes (Signed)
CSN: [id number]     Arrival date & time 03/08/13  1228 History   First MD Initiated Contact with Patient 03/08/13 1316     Chief Complaint  Patient presents with  . Rash  . Cough      HPI Comments: Patient states that he has had a mild cough for about two months, but has not felt ill.  Six days ago his cough increased, and four days ago he developed fatigue, myalgias, and fever to 100.  He has had occasional wheezing improved with his albuterol inhaler. About 5 days ago, a puppy scratched his left chin with its claw, and the area has gradually become red and slightly tender with drainage.  The history is provided by the patient and the spouse.    Past Medical History  Diagnosis Date  . Asthma   . Environmental allergies    Past Surgical History  Procedure Laterality Date  . Tonsillectomy    . Hernia repair    . Splenectomy, total     Family History  Problem Relation Age of Onset  . Cancer Other     lung  . Cancer Other     lung  . Heart attack Other   . Diabetes Mother   . Hypertension Mother   . Heart attack Father    History  Substance Use Topics  . Smoking status: Never Smoker   . Smokeless tobacco: Not on file  . Alcohol Use: No    Review of Systems + mild sore throat + cough No pleuritic pain + wheezing No nasal congestion No post-nasal drainage No sinus pain/pressure No itchy/red eyes No earache No hemoptysis No SOB + fever, + chills No nausea No vomiting No abdominal pain No diarrhea No urinary symptoms + rash on left chin + fatigue + myalgias No headache    Allergies  Review of patient's allergies indicates no known allergies.  Home Medications   Current Outpatient Rx  Name  Route  Sig  Dispense  Refill  . albuterol (PROVENTIL HFA;VENTOLIN HFA) 108 (90 BASE) MCG/ACT inhaler   Inhalation   Inhale 2 puffs into the lungs every 6 (six) hours as needed for wheezing.   1 Inhaler   2   . benzonatate (TESSALON) 200 MG capsule   Oral  Take 1 capsule (200 mg total) by mouth at bedtime. Take as needed for cough   12 capsule   0   . cetirizine (ZYRTEC) 10 MG tablet   Oral   Take 10 mg by mouth daily.         . famotidine (PEPCID) 10 MG tablet   Oral   Take 10 mg by mouth 2 (two) times daily.         Marland Kitchen EXPIRED: famotidine (PEPCID) 20 MG tablet   Oral   Take 1 tablet (20 mg total) by mouth 2 (two) times daily.   60 tablet   2   . Fluticasone-Salmeterol (ADVAIR DISKUS) 100-50 MCG/DOSE AEPB   Inhalation   Inhale 1 puff into the lungs 2 (two) times daily.   60 each   1   . Isosorbide Mononitrate (MONOKET PO)   Oral   Take by mouth.         . predniSONE (DELTASONE) 20 MG tablet   Oral   Take 1 tablet (20 mg total) by mouth 2 (two) times daily. Take with food.   10 tablet   0    BP 95/61  Pulse 81  Temp(Src) 98.2 F (  36.8 C) (Oral)  Resp 16  Ht 5\' 10"  (1.778 m)  Wt 168 lb (76.204 kg)  BMI 24.11 kg/m2  SpO2 97% Physical Exam  Constitutional: He is oriented to person, place, and time. He appears well-developed and well-nourished. No distress.  HENT:  Head: Normocephalic. Head is with abrasion.    Right Ear: Tympanic membrane normal.  Left Ear: Tympanic membrane normal.  Nose: Nose normal.  Mouth/Throat: Oropharynx is clear and moist.  As noted on the diagram, there is a 2cm dia crusted erythematous lesion with a honey colored discharge on the patient's left chin.  Eyes: Conjunctivae and EOM are normal. Pupils are equal, round, and reactive to light. Right eye exhibits no discharge. Left eye exhibits no discharge.  Neck: Neck supple.  Shotty posterior nodes are palpated  Cardiovascular: Normal heart sounds.   Pulmonary/Chest: Breath sounds normal. No respiratory distress. He has no wheezes. He has no rales.  Abdominal: There is no tenderness.  Lymphadenopathy:    He has cervical adenopathy.  Neurological: He is alert and oriented to person, place, and time.  Skin: Skin is warm and dry.     ED Course  Procedures  none    Labs Reviewed  WOUND CULTURE         MDM   1. Asthmatic bronchitis   2. Impetigo    Wound culture from lesion on left face pending. Begin Omnicef and prednisone burst.  Prescription written for Benzonatate (Tessalon) to take at bedtime for night-time cough.  Take plain Mucinex (1200 mg guaifenesin) twice daily for cough and congestion.  May add Sudafed for sinus congestion.   Increase fluid intake, rest. May use Afrin nasal spray (or generic oxymetazoline) twice daily for about 5 days.  Also recommend using saline nasal spray several times daily and saline nasal irrigation (AYR is a common brand) Continue inhalers.  Stop all antihistamines for now, and other non-prescription cough/cold preparations.   Follow-up with family doctor if not improving.Marland Kitchen.    Lattie HawStephen A Beese, MD 03/09/13 [phone]

## 2013-03-08 NOTE — Discharge Instructions (Signed)
Take plain Mucinex (1200 mg guaifenesin) twice daily for cough and congestion.  May add Sudafed for sinus congestion.   Increase fluid intake, rest. May use Afrin nasal spray (or generic oxymetazoline) twice daily for about 5 days.  Also recommend using saline nasal spray several times daily and saline nasal irrigation (AYR is a common brand) Continue inhalers.  Stop all antihistamines for now, and other non-prescription cough/cold preparations.   Follow-up with family doctor if not improving..Marland Kitchen

## 2013-03-08 NOTE — ED Notes (Signed)
Reports ongoing cough x 4 weeks (has been treated here). Also has new rash on lower left cheek/jaw area: was scratched by neighbor dog 2 days ago, then worked in yard yesterday (hx high allergy contact derm) and now has oozing/crusting area size of silver dollar.

## 2013-03-11 LAB — WOUND CULTURE
GRAM STAIN: NONE SEEN
ORGANISM ID, BACTERIA: NO GROWTH

## 2014-03-12 ENCOUNTER — Encounter: Payer: Self-pay | Admitting: *Deleted

## 2014-03-12 ENCOUNTER — Emergency Department
Admission: EM | Admit: 2014-03-12 | Discharge: 2014-03-12 | Disposition: A | Discharge status: Home / Self Care | Payer: BLUE CROSS/BLUE SHIELD | Source: Home / Self Care | Attending: Emergency Medicine | Admitting: Emergency Medicine

## 2014-03-12 DIAGNOSIS — J069 Acute upper respiratory infection, unspecified: Secondary | ICD-10-CM

## 2014-03-12 DIAGNOSIS — J3081 Allergic rhinitis due to animal (cat) (dog) hair and dander: Secondary | ICD-10-CM

## 2014-03-12 MED ORDER — PREDNISONE (PAK) 10 MG PO TABS
ORAL_TABLET | Freq: Every day | ORAL | Status: DC
Start: 2014-03-12 — End: 2014-11-27

## 2014-03-12 NOTE — ED Provider Notes (Signed)
CSN: [id number]     Arrival date & time 03/12/14  1731 History   First MD Initiated Contact with Patient 03/12/14 1741     Chief Complaint  Patient presents with  . Cough   (Consider location/radiation/quality/duration/timing/severity/associated sxs/prior Treatment) HPI Don Dominguez is a 49 y.o. male who complains of onset of cold symptoms for 5 days.  The symptoms are constant and mild-moderate in severity.  He is using over-the-counter cough and cold medicines which helped a little bit.  He does have a history of allergic rhinitis. No sore throat + cough No pleuritic pain No wheezing + nasal congestion + post-nasal drainage No sinus pain/pressure No chest congestion No itchy/red eyes No earache No hemoptysis No SOB No chills/sweats +/- fever (up to 99.5) No nausea No vomiting No abdominal pain No diarrhea No skin rashes No fatigue No myalgias No headache     Past Medical History  Diagnosis Date  . Asthma   . Environmental allergies    Past Surgical History  Procedure Laterality Date  . Tonsillectomy    . Hernia repair    . Splenectomy, total     Family History  Problem Relation Age of Onset  . Cancer Other     lung  . Cancer Other     lung  . Heart attack Other   . Diabetes Mother   . Hypertension Mother   . Heart attack Father    History  Substance Use Topics  . Smoking status: Never Smoker   . Smokeless tobacco: Not on file  . Alcohol Use: No    Review of Systems  Allergies  Review of patient's allergies indicates no known allergies.  Home Medications   Prior to Admission medications   Medication Sig Start Date End Date Taking? Authorizing Provider  albuterol (PROVENTIL HFA;VENTOLIN HFA) 108 (90 BASE) MCG/ACT inhaler Inhale 2 puffs into the lungs every 6 (six) hours as needed for wheezing. 03/08/13 03/08/14  Lattie HawStephen A Beese, MD  benzonatate (TESSALON) 200 MG capsule Take 1 capsule (200 mg total) by mouth at bedtime. Take as needed for cough 03/08/13    Lattie HawStephen A Beese, MD  cefdinir (OMNICEF) 300 MG capsule Take 1 capsule (300 mg total) by mouth 2 (two) times daily. 03/08/13   Lattie HawStephen A Beese, MD  cetirizine (ZYRTEC) 10 MG tablet Take 10 mg by mouth daily.    Historical Provider, MD  famotidine (PEPCID) 10 MG tablet Take 10 mg by mouth 2 (two) times daily.    Historical Provider, MD  famotidine (PEPCID) 20 MG tablet Take 1 tablet (20 mg total) by mouth 2 (two) times daily. 04/14/11 04/13/12  Doree AlbeeSteven Newton, MD  Fluticasone-Salmeterol (ADVAIR DISKUS) 100-50 MCG/DOSE AEPB Inhale 1 puff into the lungs 2 (two) times daily. 03/08/13   Lattie HawStephen A Beese, MD  Isosorbide Mononitrate (MONOKET PO) Take by mouth.    Historical Provider, MD  predniSONE (DELTASONE) 20 MG tablet Take 1 tablet (20 mg total) by mouth 2 (two) times daily. Take with food. 03/08/13   Lattie HawStephen A Beese, MD   BP 151/82 mmHg  Pulse 85  Temp(Src) 97.6 F (36.4 C) (Oral)  Resp 18  Ht 5\' 11"  (1.803 m)  Wt 174 lb (78.926 kg)  BMI 24.28 kg/m2  SpO2 98% Physical Exam  Constitutional: He is oriented to person, place, and time. He appears well-developed and well-nourished.  HENT:  Head: Normocephalic and atraumatic.  Right Ear: Tympanic membrane, external ear and ear canal normal.  Left Ear: Tympanic membrane, external ear and  ear canal normal.  Nose: Mucosal edema and rhinorrhea present.  Mouth/Throat: Posterior oropharyngeal erythema present. No oropharyngeal exudate or posterior oropharyngeal edema.  Eyes: No scleral icterus.  Allergic clear drainage bilaterally  Neck: Neck supple.  Cardiovascular: Regular rhythm and normal heart sounds.   Pulmonary/Chest: Effort normal and breath sounds normal. No respiratory distress. He has no decreased breath sounds. He has no wheezes. He has no rhonchi.  Neurological: He is alert and oriented to person, place, and time.  Skin: Skin is warm and dry.  Psychiatric: He has a normal mood and affect. His speech is normal.  Nursing note and vitals  reviewed.   ED Course  Procedures (including critical care time) Labs Review Labs Reviewed - No data to display  Imaging Review No results found.   MDM   1. Allergic rhinitis due to animal hair and dander   2. Viral upper respiratory illness    1)  Take the prescribed prednisone as instructed.  If the patient is not improving in the next few days on prednisone, he will call back and we can call him in a prescription for amoxicillin 875 twice a day for 7 days to cover acute sinusitis. 2)  Use nasal saline solution (over the counter) at least 3 times a day. 3)  Use over the counter decongestants like Zyrtec-D every 12 hours as needed to help with congestion.  If you have hypertension, do not take medicines with sudafed.  4)  Can take tylenol every 6 hours or motrin every 8 hours for pain or fever. 5)  Follow up with your primary doctor if no improvement in 5-7 days, sooner if increasing pain, fever, or new symptoms.     Marlaine Hind, MD 03/12/14 [phone]

## 2014-03-12 NOTE — ED Notes (Signed)
Pt c/o productive cough and temp 99.5 x 4 days.

## 2014-11-27 ENCOUNTER — Emergency Department
Admission: EM | Admit: 2014-11-27 | Discharge: 2014-11-27 | Disposition: A | Discharge status: Home / Self Care | Payer: BLUE CROSS/BLUE SHIELD | Source: Home / Self Care | Attending: Family Medicine | Admitting: Family Medicine

## 2014-11-27 DIAGNOSIS — J069 Acute upper respiratory infection, unspecified: Secondary | ICD-10-CM | POA: Diagnosis not present

## 2014-11-27 MED ORDER — AZITHROMYCIN 250 MG PO TABS: 250.0000 mg | ORAL_TABLET | Freq: Every day | ORAL | Status: DC

## 2014-11-27 MED ORDER — PREDNISONE 20 MG PO TABS: ORAL_TABLET | ORAL | Status: DC

## 2014-11-27 MED ORDER — GUAIFENESIN 100 MG/5ML PO LIQD: 100.0000 mg | ORAL | Status: DC | PRN

## 2014-11-27 NOTE — Discharge Instructions (Signed)
Please take antibiotics as prescribed and be sure to complete entire course even if you start to feel better to ensure infection does not come back.   Upper Respiratory Infection, Adult Most upper respiratory infections (URIs) are a viral infection of the air passages leading to the lungs. A URI affects the nose, throat, and upper air passages. The most common type of URI is nasopharyngitis and is typically referred to as "the common cold." URIs run their course and usually go away on their own. Most of the time, a URI does not require medical attention, but sometimes a bacterial infection in the upper airways can follow a viral infection. This is called a secondary infection. Sinus and middle ear infections are common types of secondary upper respiratory infections. Bacterial pneumonia can also complicate a URI. A URI can worsen asthma and chronic obstructive pulmonary disease (COPD). Sometimes, these complications can require emergency medical care and may be life threatening.  CAUSES Almost all URIs are caused by viruses. A virus is a type of germ and can spread from one person to another.  RISKS FACTORS You may be at risk for a URI if:   You smoke.   You have chronic heart or lung disease.  You have a weakened defense (immune) system.   You are very young or very old.   You have nasal allergies or asthma.  You work in crowded or poorly ventilated areas.  You work in health care facilities or schools. SIGNS AND SYMPTOMS  Symptoms typically develop 2-3 days after you come in contact with a cold virus. Most viral URIs last 7-10 days. However, viral URIs from the influenza virus (flu virus) can last 14-18 days and are typically more severe. Symptoms may include:   Runny or stuffy (congested) nose.   Sneezing.   Cough.   Sore throat.   Headache.   Fatigue.   Fever.   Loss of appetite.   Pain in your forehead, behind your eyes, and over your cheekbones (sinus  pain).  Muscle aches.  DIAGNOSIS  Your health care provider may diagnose a URI by:  Physical exam.  Tests to check that your symptoms are not due to another condition such as:  Strep throat.  Sinusitis.  Pneumonia.  Asthma. TREATMENT  A URI goes away on its own with time. It cannot be cured with medicines, but medicines may be prescribed or recommended to relieve symptoms. Medicines may help:  Reduce your fever.  Reduce your cough.  Relieve nasal congestion. HOME CARE INSTRUCTIONS   Take medicines only as directed by your health care provider.   Gargle warm saltwater or take cough drops to comfort your throat as directed by your health care provider.  Use a warm mist humidifier or inhale steam from a shower to increase air moisture. This may make it easier to breathe.  Drink enough fluid to keep your urine clear or pale yellow.   Eat soups and other clear broths and maintain good nutrition.   Rest as needed.   Return to work when your temperature has returned to normal or as your health care provider advises. You may need to stay home longer to avoid infecting others. You can also use a face mask and careful hand washing to prevent spread of the virus.  Increase the usage of your inhaler if you have asthma.   Do not use any tobacco products, including cigarettes, chewing tobacco, or electronic cigarettes. If you need help quitting, ask your health care provider.  PREVENTION  The best way to protect yourself from getting a cold is to practice good hygiene.   Avoid oral or hand contact with people with cold symptoms.   Wash your hands often if contact occurs.  There is no clear evidence that vitamin C, vitamin E, echinacea, or exercise reduces the chance of developing a cold. However, it is always recommended to get plenty of rest, exercise, and practice good nutrition.  SEEK MEDICAL CARE IF:   You are getting worse rather than better.   Your symptoms are  not controlled by medicine.   You have chills.  You have worsening shortness of breath.  You have brown or red mucus.  You have yellow or brown nasal discharge.  You have pain in your face, especially when you bend forward.  You have a fever.  You have swollen neck glands.  You have pain while swallowing.  You have white areas in the back of your throat. SEEK IMMEDIATE MEDICAL CARE IF:   You have severe or persistent:  Headache.  Ear pain.  Sinus pain.  Chest pain.  You have chronic lung disease and any of the following:  Wheezing.  Prolonged cough.  Coughing up blood.  A change in your usual mucus.  You have a stiff neck.  You have changes in your:  Vision.  Hearing.  Thinking.  Mood. MAKE SURE YOU:   Understand these instructions.  Will watch your condition.  Will get help right away if you are not doing well or get worse.   This information is not intended to replace advice given to you by your health care provider. Make sure you discuss any questions you have with your health care provider.   Document Released: 07/18/2000 Document Revised: 06/08/2014 Document Reviewed: 04/29/2013 Elsevier Interactive Patient Education 2016 [employer]  [employer] Mist Vaporizers Vaporizers may help relieve the symptoms of a cough and cold. They add moisture to the air, which helps mucus to become thinner and less sticky. This makes it easier to breathe and cough up secretions. Cool mist vaporizers do not cause serious burns like hot mist vaporizers, which may also be called steamers or humidifiers. Vaporizers have not been proven to help with colds. You should not use a vaporizer if you are allergic to mold. HOME CARE INSTRUCTIONS  Follow the package instructions for the vaporizer.  Do not use anything other than distilled water in the vaporizer.  Do not run the vaporizer all of the time. This can cause mold or bacteria to grow in the vaporizer.  Clean the  vaporizer after each time it is used.  Clean and dry the vaporizer well before storing it.  Stop using the vaporizer if worsening respiratory symptoms develop.   This information is not intended to replace advice given to you by your health care provider. Make sure you discuss any questions you have with your health care provider.   Document Released: 10/20/2003 Document Revised: 01/27/2013 Document Reviewed: 06/11/2012 Elsevier Interactive Patient Education [employer] Elsevier Inc.

## 2014-11-27 NOTE — ED Notes (Signed)
Patient states that he has had a  persistent nagging cough for approx 6 days. States sometimes productive with color

## 2014-11-27 NOTE — ED Provider Notes (Signed)
CSN: [id number]     Arrival date & time 11/27/14  1548 History   First MD Initiated Contact with Patient 11/27/14 1555     Chief Complaint  Patient presents with  . Cough   (Consider location/radiation/quality/duration/timing/severity/associated sxs/prior Treatment) HPI  Pt is a 49yo male with hx of asthma, [facility]/o gradually worsening moderate to severe productive cough for 6 days.  He reports occasionally productive with yellow sputum.  He also has a mild sore throat. He has been trying leftover guaifenesin as well as his albuterol inhaler with mild relief.  Denies sick contacts or recent travel. Denies fever, chills, n/v/d. He has not received his flu vaccine yet as he was was going to get it this past week but then became sick.  Past Medical History  Diagnosis Date  . Asthma   . Environmental allergies    Past Surgical History  Procedure Laterality Date  . Tonsillectomy    . Hernia repair    . Splenectomy, total     Family History  Problem Relation Age of Onset  . Cancer Other     lung  . Cancer Other     lung  . Heart attack Other   . Diabetes Mother   . Hypertension Mother   . Heart attack Father    Social History  Substance Use Topics  . Smoking status: Never Smoker   . Smokeless tobacco: None  . Alcohol Use: No    Review of Systems  Constitutional: Negative for fever and chills.  HENT: Positive for congestion and sore throat. Negative for ear pain, trouble swallowing and voice change.   Respiratory: Positive for cough and wheezing. Negative for shortness of breath.   Cardiovascular: Negative for chest pain and palpitations.  Gastrointestinal: Negative for nausea, vomiting, abdominal pain and diarrhea.  Musculoskeletal: Negative for myalgias, back pain and arthralgias.  Skin: Negative for rash.  All other systems reviewed and are negative.   Allergies  Review of patient's allergies indicates no known allergies.  Home Medications    Prior to Admission medications   Medication Sig Start Date End Date Taking? Authorizing Provider  albuterol (PROVENTIL HFA;VENTOLIN HFA) 108 (90 BASE) MCG/ACT inhaler Inhale 2 puffs into the lungs every 6 (six) hours as needed for wheezing. 03/08/13 03/08/14  Lattie HawStephen A Beese, MD  azithromycin (ZITHROMAX) 250 MG tablet Take 1 tablet (250 mg total) by mouth daily. Take first 2 tablets together, then 1 every day until finished. 11/27/14   Junius FinnerErin O'Malley, PA-C  cetirizine (ZYRTEC) 10 MG tablet Take 10 mg by mouth daily.    Historical Provider, MD  famotidine (PEPCID) 10 MG tablet Take 10 mg by mouth 2 (two) times daily.    Historical Provider, MD  famotidine (PEPCID) 20 MG tablet Take 1 tablet (20 mg total) by mouth 2 (two) times daily. 04/14/11 04/13/12  Floydene FlockSteven J Newton, MD  Fluticasone-Salmeterol (ADVAIR DISKUS) 100-50 MCG/DOSE AEPB Inhale 1 puff into the lungs 2 (two) times daily. 03/08/13   Lattie HawStephen A Beese, MD  guaiFENesin (ROBITUSSIN) 100 MG/5ML liquid Take 5-10 mLs (100-200 mg total) by mouth every 4 (four) hours as needed for cough. 11/27/14   Junius FinnerErin O'Malley, PA-C  Isosorbide Mononitrate (MONOKET PO) Take by mouth.    Historical Provider, MD  predniSONE (DELTASONE) 20 MG tablet 3 tabs po day one, then 2 po daily x 4 days 11/27/14   Junius FinnerErin O'Malley, PA-C   Meds Ordered and Administered this Visit  Medications - No data to display  BP 148/87 mmHg  Pulse 81  Temp(Src) 98 F (36.7 C)  Ht  (1.803 m)  Wt 167 lb (75.751 kg)  BMI 23.30 kg/m2  SpO2 98% No data found.   Physical Exam  Constitutional: He appears well-developed and well-nourished.  HENT:  Head: Normocephalic and atraumatic.  Right Ear: External ear normal.  Left Ear: External ear normal.  Nose: Nose normal.  Mouth/Throat: Oropharynx is clear and moist. No oropharyngeal exudate.  Eyes: Conjunctivae are normal. No scleral icterus.  Neck: Normal range of motion. Neck supple.  Cardiovascular: Normal rate, regular rhythm and normal  heart sounds.   Pulmonary/Chest: Effort normal and breath sounds normal. No respiratory distress. He has no wheezes. He has no rales. He exhibits no tenderness.  Dry hacking cough during exam. No respiratory distress. Able to speak in full sentences. Lungs: CTAB  Abdominal: Soft. He exhibits no distension and no mass. There is no tenderness. There is no rebound and no guarding.  Musculoskeletal: Normal range of motion.  Neurological: He is alert.  Skin: Skin is warm and dry.  Nursing note and vitals reviewed.   ED Course  Procedures (including critical care time)  Labs Review Labs Reviewed - No data to display  Imaging Review No results found.    MDM   1. Acute upper respiratory infection     Pt is a 49yo male with hx of asthma c/o worsening cough for 6 days. O2 Sat 98% on RA Lungs: CTAB, hacking cough during exam Rx: azithromycin, prednisone, and guaifenesin refill Advised pt to use acetaminophen and ibuprofen as needed for fever and pain. Encouraged rest and fluids. F/u with PCP in 1 week if not improving, sooner if worsening. Pt verbalized understanding and agreement with tx plan.     Junius Finner, PA-C 11/27/14 [phone]

## 2014-12-01 ENCOUNTER — Telehealth: Payer: Self-pay | Admitting: Emergency Medicine

## 2015-06-11 ENCOUNTER — Encounter: Payer: Self-pay | Admitting: Emergency Medicine

## 2015-06-11 ENCOUNTER — Emergency Department
Admission: EM | Admit: 2015-06-11 | Discharge: 2015-06-11 | Disposition: A | Discharge status: Home / Self Care | Payer: BLUE CROSS/BLUE SHIELD | Source: Home / Self Care | Attending: Emergency Medicine | Admitting: Emergency Medicine

## 2015-06-11 DIAGNOSIS — J0100 Acute maxillary sinusitis, unspecified: Secondary | ICD-10-CM

## 2015-06-11 DIAGNOSIS — J4521 Mild intermittent asthma with (acute) exacerbation: Secondary | ICD-10-CM

## 2015-06-11 MED ORDER — PREDNISONE 50 MG PO TABS
50.0000 mg | ORAL_TABLET | Freq: Every day | ORAL | Status: DC
Start: 2015-06-11 — End: 2015-11-23

## 2015-06-11 MED ORDER — IPRATROPIUM-ALBUTEROL 0.5-2.5 (3) MG/3ML IN SOLN
3.0000 mL | RESPIRATORY_TRACT | Status: AC
  Administered 2015-06-11: 3 mL via RESPIRATORY_TRACT

## 2015-06-11 MED ORDER — AMOXICILLIN 875 MG PO TABS: ORAL_TABLET | ORAL | Status: DC

## 2015-06-11 MED ORDER — PROMETHAZINE-CODEINE 6.25-10 MG/5ML PO SYRP: ORAL_SOLUTION | ORAL | Status: DC

## 2015-06-11 NOTE — ED Notes (Signed)
[facility]/O cough cold and nasal congestion times 6 days with worsening symptoms low grade fever on Wed. And Thurs. Productive cough with clear sputum. History of allergies and asthma.

## 2015-06-11 NOTE — ED Provider Notes (Signed)
CSN: [id number]     Arrival date & time 06/11/15  1131 History   First MD Initiated Contact with Patient 06/11/15 1137     Chief Complaint  Patient presents with  . Nasal Congestion  . Cough   (Consider location/radiation/quality/duration/timing/severity/associated sxs/prior Treatment) HPI URI HISTORY  Tell is a 50 y.o. male who complains of onset of One week of cold symptoms with sinus congestion and cough occasionally productive of yellow sputum and yellow rhinorrhea, associated with worsening chest congestion and wheezing and sneezing and seasonal allergy flareup.   Have been using over-the-counter treatment which helps a little bit. He states he tends to have allergy and asthma flareups this time of year when the pollen is high.  His PCP follows him for asthma, and he states he is supposed to use Advair chronically as preventative medicine, but he admits noncompliance. He states he has not needed albuterol rescue inhaler recently.  No chills/sweats + Low-grade Fever  +  Nasal congestion +  Discolored Post-nasal drainage Mild sinus pain/pressure No sore throat  +  cough Positive wheezing Positive chest congestion No hemoptysis No shortness of breath No pleuritic pain  No itchy/red eyes No earache  No nausea No vomiting No abdominal pain No diarrhea  No skin rashes +  Fatigue No myalgias No headache   Past Medical History  Diagnosis Date  . Asthma   . Environmental allergies    Past Surgical History  Procedure Laterality Date  . Tonsillectomy    . Hernia repair    . Splenectomy, total     Family History  Problem Relation Age of Onset  . Cancer Other     lung  . Cancer Other     lung  . Heart attack Other   . Diabetes Mother   . Hypertension Mother   . Heart attack Father    Social History  Substance Use Topics  . Smoking status: Never Smoker   . Smokeless tobacco: None  . Alcohol Use: No    Review of Systems  All other systems reviewed  and are negative.   Allergies  Review of patient's allergies indicates no known allergies.  Home Medications   Prior to Admission medications   Medication Sig Start Date End Date Taking? Authorizing Provider  albuterol (PROVENTIL HFA;VENTOLIN HFA) 108 (90 BASE) MCG/ACT inhaler Inhale 2 puffs into the lungs every 6 (six) hours as needed for wheezing. 03/08/13 03/08/14  Lattie Haw, MD  amoxicillin (AMOXIL) 875 MG tablet Take 1 twice a day X 10 days. 06/11/15   Lajean Manes, MD  cetirizine (ZYRTEC) 10 MG tablet Take 10 mg by mouth daily.    Historical Provider, MD  Fluticasone-Salmeterol (ADVAIR DISKUS) 100-50 MCG/DOSE AEPB Inhale 1 puff into the lungs 2 (two) times daily. 03/08/13   Lattie Haw, MD  guaiFENesin (ROBITUSSIN) 100 MG/5ML liquid Take 5-10 mLs (100-200 mg total) by mouth every 4 (four) hours as needed for cough. 11/27/14   Junius Finner, PA-C  predniSONE (DELTASONE) 50 MG tablet Take 1 tablet (50 mg total) by mouth daily with breakfast. For 5 days. 06/11/15   Lajean Manes, MD  promethazine-codeine [facility] WITH CODEINE) 6.25-10 MG/5ML syrup Take 1-2 teaspoons at bedtime as needed for severe cough. May cause drowsiness. 06/11/15   Lajean Manes, MD   Meds Ordered and Administered this Visit   Medications  ipratropium-albuterol (DUONEB) 0.5-2.5 (3) MG/3ML nebulizer solution 3 mL (3 mLs Nebulization Given 06/11/15 1300)    Pulse 84  Temp(Src) 98.2 F (  36.8 C) (Oral)  Resp 16  Ht 5\' 11"  (1.803 m)  Wt 167 lb 12 oz (76.091 kg)  BMI 23.41 kg/m2  SpO2 96% No data found.   Physical Exam  Constitutional: He is oriented to person, place, and time. He appears well-developed and well-nourished. No distress.  HENT:  Head: Normocephalic and atraumatic.  Right Ear: Tympanic membrane, external ear and ear canal normal.  Left Ear: Tympanic membrane, external ear and ear canal normal.  Nose: Mucosal edema and rhinorrhea present. Right sinus exhibits maxillary sinus tenderness. Left sinus  exhibits maxillary sinus tenderness.  Mouth/Throat: Oropharynx is clear and moist. No oral lesions. No oropharyngeal exudate.  Eyes: Right eye exhibits no discharge. Left eye exhibits no discharge. No scleral icterus.  Neck: Neck supple.  Cardiovascular: Normal rate, regular rhythm and normal heart sounds.   Pulmonary/Chest: Effort normal. He has wheezes (Mild late expiratory bilaterally). He has rhonchi. He has no rales.  Lymphadenopathy:    He has no cervical adenopathy.  Neurological: He is alert and oriented to person, place, and time.  Skin: Skin is warm and dry.  Nursing note and vitals reviewed.  above vital signs reviewed. O2 saturation 96% on room air.  Clinical Course  DuoNeb nebulizer treatment given. Wheezing improved. Oxygen saturation improved to 98%   Procedures (including critical care time)  Labs Review Labs Reviewed - No data to display    MDM    1. Asthma with acute exacerbation, mild intermittent   2. Acute maxillary sinusitis, recurrence not specified    Treatment options discussed, as well as risks, benefits, alternatives. Patient voiced understanding and agreement with the following plans: Discharge Medication List as of 06/11/2015 12:50 PM    START taking these medications   Details  amoxicillin (AMOXIL) 875 MG tablet Take 1 twice a day X 10 days., Print    promethazine-codeine (PHENERGAN WITH CODEINE) 6.25-10 MG/5ML syrup Take 1-2 teaspoons at bedtime as needed for severe cough. May cause drowsiness., Print       He declined any IM treatment. An After Visit Summary was printed and given to the patient. Follow-up with your primary care doctor in 5-7 days if not improving, or sooner if symptoms become worse. Precautions discussed. Red flags discussed. Questions invited and answered. Patient voiced understanding and agreement.     Lajean Manesavid Massey, MD 06/11/15 [id number]

## 2015-06-11 NOTE — Discharge Instructions (Signed)
Asthma, Adult Asthma is a condition of the lungs in which the airways tighten and narrow. Asthma can make it hard to breathe. Asthma cannot be cured, but medicine and lifestyle changes can help control it. Asthma may be started (triggered) by:  Animal skin flakes (dander).  Dust.  Cockroaches.  Pollen.  Mold.  Smoke.  Cleaning products.  Hair sprays or aerosol sprays.  Paint fumes or strong smells.  Cold air, weather changes, and winds.  Crying or laughing hard.  Stress.  Certain medicines or drugs.  Foods, such as dried fruit, potato chips, and sparkling grape juice.  Infections or conditions (colds, flu).  Exercise.  Certain medical conditions or diseases.  Exercise or tiring activities. HOME CARE   Take medicine as told by your doctor.  Use a peak flow meter as told by your doctor. A peak flow meter is a tool that measures how well the lungs are working.  Record and keep track of the peak flow meter's readings.  Understand and use the asthma action plan. An asthma action plan is a written plan for taking care of your asthma and treating your attacks.  To help prevent asthma attacks:  Do not smoke. Stay away from secondhand smoke.  Change your heating and air conditioning filter often.  Limit your use of fireplaces and wood stoves.  Get rid of pests (such as roaches and mice) and their droppings.  Throw away plants if you see mold on them.  Clean your floors. Dust regularly. Use cleaning products that do not smell.  Have someone vacuum when you are not home. Use a vacuum cleaner with a HEPA filter if possible.  Replace carpet with wood, tile, or vinyl flooring. Carpet can trap animal skin flakes and dust.  Use allergy-proof pillows, mattress covers, and box spring covers.  Wash bed sheets and blankets every week in hot water and dry them in a dryer.  Use blankets that are made of polyester or cotton.  Clean bathrooms and kitchens with bleach.  If possible, have someone repaint the walls in these rooms with mold-resistant paint. Keep out of the rooms that are being cleaned and painted.  Wash hands often. GET HELP IF:  You have make a whistling sound when breaking (wheeze), have shortness of breath, or have a cough even if taking medicine to prevent attacks.  The colored mucus you cough up (sputum) is thicker than usual.  The colored mucus you cough up changes from clear or white to yellow, green, gray, or bloody.  You have problems from the medicine you are taking such as:  A rash.  Itching.  Swelling.  Trouble breathing.  You need reliever medicines more than 2-3 times a week.  Your peak flow measurement is still at 50-79% of your personal best after following the action plan for 1 hour.  You have a fever. GET HELP RIGHT AWAY IF:   You seem to be worse and are not responding to medicine during an asthma attack.  You are short of breath even at rest.  You get short of breath when doing very little activity.  You have trouble eating, drinking, or talking.  You have chest pain.  You have a fast heartbeat.  Your lips or fingernails start to turn blue.  You are light-headed, dizzy, or faint.  Your peak flow is less than 50% of your personal best.   This information is not intended to replace advice given to you by your health care provider. Make sure   you discuss any questions you have with your health care provider.   Document Released: 07/11/2007 Document Revised: 10/13/2014 Document Reviewed: 08/21/2012 Elsevier Interactive Patient Education 2016 Elsevier Inc.  

## 2015-11-23 ENCOUNTER — Emergency Department
Admission: EM | Admit: 2015-11-23 | Discharge: 2015-11-23 | Disposition: A | Discharge status: Home / Self Care | Payer: BLUE CROSS/BLUE SHIELD | Source: Home / Self Care | Attending: Emergency Medicine | Admitting: Emergency Medicine

## 2015-11-23 ENCOUNTER — Encounter: Payer: Self-pay | Admitting: Emergency Medicine

## 2015-11-23 DIAGNOSIS — M5431 Sciatica, right side: Secondary | ICD-10-CM

## 2015-11-23 DIAGNOSIS — S39012A Strain of muscle, fascia and tendon of lower back, initial encounter: Secondary | ICD-10-CM | POA: Diagnosis not present

## 2015-11-23 MED ORDER — CYCLOBENZAPRINE HCL 10 MG PO TABS: 10.0000 mg | ORAL_TABLET | Freq: Every day | ORAL | 0 refills | Status: DC

## 2015-11-23 MED ORDER — MELOXICAM 7.5 MG PO TABS: ORAL_TABLET | ORAL | 0 refills | Status: DC

## 2015-11-23 MED ORDER — PREDNISONE 50 MG PO TABS: ORAL_TABLET | ORAL | 0 refills | Status: DC

## 2015-11-23 NOTE — ED Triage Notes (Signed)
Right low back pain x 3 days, started feeling "quickie-like" a few days ago, took naproxen laid on heat and ice and feels intermittent sharp pain 7-10.

## 2015-11-23 NOTE — ED Provider Notes (Signed)
[facility]    CSN: [id number] Arrival date & time: 11/23/15  1456     History   Chief Complaint Chief Complaint  Patient presents with  . Back Pain    HPI Don Dominguez is a 50 y.o. male.   HPI Sharp and dull Right low back pain x 3 days, started feeling "quickie-like" a few days ago, then the acute pain and low back occurred after bending over. Occasional radiation and numbness to right knee but not beyond. Denies focal weakness or incontinence or bowel or bladder dysfunction.  took naproxen, laid on heat and ice, without significant improvement, and feels intermittent sharp pain 7/10.  I reviewed in EMR, was seen for similar problem at [facility] emergency room 10/26/2014, x-ray LS-spine and then showed some degenerative changes, but otherwise within normal limits.--Those symptoms resolved within a couple of weeks at that time. Past Medical History:  Diagnosis Date  . Asthma   . Environmental allergies     There are no active problems to display for this patient.   Past Surgical History:  Procedure Laterality Date  . HERNIA REPAIR    . SPLENECTOMY, TOTAL    . TONSILLECTOMY         Home Medications    Prior to Admission medications   Medication Sig Start Date End Date Taking? Authorizing Provider  naproxen sodium (ANAPROX) 220 MG tablet Take 220 mg by mouth 2 (two) times daily with a meal.   Yes Historical Provider, MD  cetirizine (ZYRTEC) 10 MG tablet Take 10 mg by mouth daily.    Historical Provider, MD  cyclobenzaprine (FLEXERIL) 10 MG tablet Take 1 tablet (10 mg total) by mouth at bedtime. For muscle relaxant 11/23/15   Lajean Manes, MD  meloxicam (MOBIC) 7.5 MG tablet Take 1 twice a day as needed for pain. Take with food. (Do not take with any other NSAID.) 11/23/15   Lajean Manes, MD  predniSONE (DELTASONE) 50 MG tablet Take 1 daily with a meal for 5 days 11/23/15   Lajean Manes, MD    Family History Family History  Problem Relation Age of  Onset  . Cancer Other     lung  . Cancer Other     lung  . Heart attack Other   . Diabetes Mother   . Hypertension Mother   . Heart attack Father     Social History Social History  Substance Use Topics  . Smoking status: Never Smoker  . Smokeless tobacco: Never Used  . Alcohol use No     Allergies   Review of patient's allergies indicates no known allergies.   Review of Systems Review of Systems  All other systems reviewed and are negative.    Physical Exam Triage Vital Signs ED Triage Vitals  Enc Vitals Group     BP 11/23/15 1515 113/71     Pulse Rate 11/23/15 1515 88     Resp --      Temp 11/23/15 1515 98.2 F (36.8 C)     Temp Source 11/23/15 1515 Oral     SpO2 11/23/15 1515 97 %     Weight 11/23/15 1516 169 lb (76.7 kg)     Height 11/23/15 1516 5\' 11"  (1.803 m)     Head Circumference --      Peak Flow --      Pain Score 11/23/15 1518 7     Pain Loc --      Pain Edu? --      Excl.  in GC? --    No data found.   Updated Vital Signs BP 113/71 (BP Location: Left Arm)   Pulse 88   Temp 98.2 F (36.8 C) (Oral)   Ht 5\' 11"  (1.803 m)   Wt 169 lb (76.7 kg)   SpO2 97%   BMI 23.57 kg/m   Visual Acuity Right Eye Distance:   Left Eye Distance:   Bilateral Distance:    Right Eye Near:   Left Eye Near:    Bilateral Near:     Physical Exam  Constitutional: He is oriented to person, place, and time. He appears well-developed and well-nourished. He is cooperative.  Non-toxic appearance. He appears distressed (Appears uncomfortable from low back pain.).  HENT:  Head: Normocephalic and atraumatic.  Mouth/Throat: Oropharynx is clear and moist.  Eyes: EOM are normal. Pupils are equal, round, and reactive to light. No scleral icterus.  Neck: Neck supple.  Cardiovascular: Regular rhythm and normal heart sounds.   Pulmonary/Chest: Effort normal and breath sounds normal. No respiratory distress. He has no wheezes. He has no rales. He exhibits no tenderness.    Abdominal: Soft. There is no tenderness.  Musculoskeletal:       Right hip: Normal.       Left hip: Normal.       Cervical back: He exhibits no tenderness.       Thoracic back: He exhibits no tenderness.       Lumbar back: He exhibits decreased range of motion, tenderness and spasm. He exhibits no bony tenderness (No point tenderness directly over the midline LS spine ), no swelling, no edema, no deformity, no laceration and normal pulse.  Negative Right straight leg-raise test. Negative Left straight leg-raise test.  Negative Right Luisa HartPatrick test. Negative Left Luisa HartPatrick test.    Neurological: He is alert and oriented to person, place, and time. He has normal strength. He displays no atrophy, no tremor and normal reflexes. No cranial nerve deficit or sensory deficit. He exhibits normal muscle tone. Gait normal.  Reflex Scores:      Patellar reflexes are 2+ on the right side and 2+ on the left side.      Achilles reflexes are 2+ on the right side and 2+ on the left side. Skin: Skin is warm, dry and intact. No lesion and no rash noted.  Psychiatric: He has a normal mood and affect.  Nursing note and vitals reviewed.    UC Treatments / Results  Labs (all labs ordered are listed, but only abnormal results are displayed) Labs Reviewed - No data to display  EKG  EKG Interpretation None       Radiology No results found.  Procedures Procedures (including critical care time)  Medications Ordered in UC Medications - No data to display   Initial Impression / Assessment and Plan / UC Course  I have reviewed the triage vital signs and the nursing notes.  Pertinent labs & imaging results that were available during my care of the patient were reviewed by me and considered in my medical decision making (see chart for details).  Clinical Course    Discussed imaging options, and patient agrees that no imaging indicated at this time because he has no point tenderness over spinous  processes, and neurologic exam intact today. By his history, and physical exam, he likely has lumbar strain, but could also have sciatica on the right side, with normal neurologic exam, and negative straight leg tests bilaterally.   Final Clinical Impressions(s) / UC  Diagnoses   Final diagnoses:  Lumbar strain, initial encounter  Sciatica of right side   Risks, benefits, alternatives of treatment discussed. He agrees with the following plans:  New Prescriptions Discharge Medication List as of 11/23/2015  3:47 PM     cyclobenzaprine (FLEXERIL) 10 MG tablet Take 1 tablet (10 mg total) by mouth at bedtime. For muscle relaxant 10 tablet Lajean Manes, MD  meloxicam (MOBIC) 7.5 MG tablet Take 1 twice a day as needed for pain. Take with food. (Do not take with any other NSAID.) 20 tablet Lajean Manes, MD  predniSONE (DELTASONE) 50 MG tablet Take 1 daily with a meal for 5 days 5 tablet Lajean Manes, MD   Rest, heat, ice, and Other symptomatic care and precautions discussed. An After Visit Summary was printed and given to the patient. Follow-up with PCP or orthopedist within 1 week, sooner if worse or new symptoms. If any red flags, go to emergency room He voiced understanding and agreement       Lajean Manes, MD 11/27/15 1727

## 2016-01-21 ENCOUNTER — Emergency Department
Admission: EM | Admit: 2016-01-21 | Discharge: 2016-01-21 | Disposition: A | Discharge status: Home / Self Care | Payer: BLUE CROSS/BLUE SHIELD | Source: Home / Self Care | Attending: Family Medicine | Admitting: Family Medicine

## 2016-01-21 DIAGNOSIS — J069 Acute upper respiratory infection, unspecified: Secondary | ICD-10-CM | POA: Diagnosis not present

## 2016-01-21 DIAGNOSIS — B9789 Other viral agents as the cause of diseases classified elsewhere: Secondary | ICD-10-CM

## 2016-01-21 MED ORDER — IPRATROPIUM BROMIDE 0.06 % NA SOLN: 2.0000 | Freq: Four times a day (QID) | NASAL | 12 refills | Status: DC

## 2016-01-21 MED ORDER — GUAIFENESIN ER 600 MG PO TB12: 600.0000 mg | ORAL_TABLET | Freq: Two times a day (BID) | ORAL | 0 refills | Status: DC | PRN

## 2016-01-21 MED ORDER — BENZONATATE 100 MG PO CAPS: 100.0000 mg | ORAL_CAPSULE | Freq: Three times a day (TID) | ORAL | 0 refills | Status: DC

## 2016-01-21 NOTE — ED Triage Notes (Signed)
Started Thursday with cough, stuffy nose.  Pt states that he feels some better today, but wanted to be checked.

## 2016-01-21 NOTE — Discharge Instructions (Signed)
°  For the Ipratropium nasal spray, be sure to use as prescribed to help prevent post-nasal drip, which can trigger coughing, especially at night. ° °Use 2 sprays per nostril 4 times daily while sick.  You should spray one time in each nostril pointing the spray to the out portion of your nostril, breath in slowly while spraying. Wait about 30 seconds to 1 minute before given the second spray in each nostril.  This will ensure you get the most benefit from each spray.   ° °

## 2016-01-21 NOTE — ED Provider Notes (Signed)
CSN: [id number]     Arrival date & time 01/21/16  1023 History   None    Chief Complaint  Patient presents with  . Cough   (Consider location/radiation/quality/duration/timing/severity/associated sxs/prior Treatment) HPI  Don Dominguez is a 50 y.o. male presenting to UC with c/o mild intermittent cough that started 3 days ago with mild rhinorrhea and minimal sore throat with bilateral ear pressure. He has taken some ibuprofen during the day and left over cheritussin at night with moderate relief. Pt notes his wife recently got out of the hospital so she encouraged him to be evaluated in hopes he will not get her sick. Pt denies fever, chills, n/v/d.    Past Medical History:  Diagnosis Date  . Asthma   . Environmental allergies    Past Surgical History:  Procedure Laterality Date  . HERNIA REPAIR    . SPLENECTOMY, TOTAL    . TONSILLECTOMY     Family History  Problem Relation Age of Onset  . Cancer Other     lung  . Cancer Other     lung  . Heart attack Other   . Diabetes Mother   . Hypertension Mother   . Heart attack Father    Social History  Substance Use Topics  . Smoking status: Never Smoker  . Smokeless tobacco: Never Used  . Alcohol use No    Review of Systems  Constitutional: Negative for chills and fever.  HENT: Positive for congestion, ear pain ( pressure), rhinorrhea and sore throat. Negative for postnasal drip, trouble swallowing and voice change.   Respiratory: Positive for cough. Negative for shortness of breath.   Cardiovascular: Negative for chest pain and palpitations.  Gastrointestinal: Negative for abdominal pain, diarrhea, nausea and vomiting.  Musculoskeletal: Negative for arthralgias, back pain and myalgias.  Skin: Negative for rash.  Neurological: Negative for dizziness, light-headedness and headaches.    Allergies  Patient has no known allergies.  Home Medications   Prior to Admission medications   Medication Sig Start Date End Date  Taking? Authorizing Provider  benzonatate (TESSALON) 100 MG capsule Take 1-2 capsules (100-200 mg total) by mouth every 8 (eight) hours. 01/21/16   Junius FinnerErin O'Malley, PA-C  cetirizine (ZYRTEC) 10 MG tablet Take 10 mg by mouth daily.    Historical Provider, MD  cyclobenzaprine (FLEXERIL) 10 MG tablet Take 1 tablet (10 mg total) by mouth at bedtime. For muscle relaxant 11/23/15   Lajean Manesavid Massey, MD  guaiFENesin (MUCINEX) 600 MG 12 hr tablet Take 1-2 tablets (600-1,200 mg total) by mouth 2 (two) times daily as needed. Take with large glass of water 01/21/16   Junius FinnerErin O'Malley, PA-C  ipratropium (ATROVENT) 0.06 % nasal spray Place 2 sprays into both nostrils 4 (four) times daily. 01/21/16   Junius FinnerErin O'Malley, PA-C  meloxicam (MOBIC) 7.5 MG tablet Take 1 twice a day as needed for pain. Take with food. (Do not take with any other NSAID.) 11/23/15   Lajean Manesavid Massey, MD  naproxen sodium (ANAPROX) 220 MG tablet Take 220 mg by mouth 2 (two) times daily with a meal.    Historical Provider, MD  predniSONE (DELTASONE) 50 MG tablet Take 1 daily with a meal for 5 days 11/23/15   Lajean Manesavid Massey, MD   Meds Ordered and Administered this Visit  Medications - No data to display  BP 117/64 (BP Location: Left Arm)   Pulse 81   Temp 98.3 F (36.8 C) (Oral)   Ht 5\' 11"  (1.803 m)   Wt 176 lb (79.8  kg)   SpO2 97%   BMI 24.55 kg/m  No data found.   Physical Exam  Constitutional: He appears well-developed and well-nourished. No distress.  HENT:  Head: Normocephalic and atraumatic.  Right Ear: Tympanic membrane normal.  Left Ear: Tympanic membrane normal.  Nose: Nose normal.  Mouth/Throat: Uvula is midline, oropharynx is clear and moist and mucous membranes are normal.  Eyes: Conjunctivae are normal. No scleral icterus.  Neck: Normal range of motion. Neck supple.  Cardiovascular: Normal rate, regular rhythm and normal heart sounds.   Pulmonary/Chest: Effort normal and breath sounds normal. No stridor. No respiratory distress.  He has no wheezes. He has no rales.  Abdominal: There is no tenderness. There is no guarding.  Musculoskeletal: Normal range of motion.  Lymphadenopathy:    He has no cervical adenopathy.  Neurological: He is alert.  Skin: Skin is warm and dry. He is not diaphoretic.  Nursing note and vitals reviewed.   Urgent Care Course   Clinical Course as of Jan 21 1115  Sat Jan 21, 2016  1101 Pulse Rate: 81 [EO]    Clinical Course User Index [EO] Junius FinnerErin O'Malley, PA-C    Procedures (including critical care time)  Labs Review Labs Reviewed - No data to display  Imaging Review No results found.   MDM   1. Viral URI with cough    Pt c/o mild URI symptoms for 3 days. No evidence of bacterial infection at this time. Encouraged symptomatic treatment for viral infection.  Rx: Tessalon, mucinex, and ipratropium nasal spray.  F/u with PCP in 1 week if not improving. Patient verbalized understanding and agreement with treatment plan.     Junius Finnerrin O'Malley, PA-C 01/21/16 [phone]

## 2016-01-23 ENCOUNTER — Telehealth: Payer: Self-pay | Admitting: *Deleted

## 2016-01-23 NOTE — Telephone Encounter (Signed)
Pt's wife called reports that his cough is no better and he is not sleeping. Denies fever. Per Dr Cathren HarshBeese remind him to take Mucinex 1200mg  BID and increase water, also rx written for Robtussin AC take 10ml PO HS prn cough #13420ml/0RF. Left rx at the front desk for pick up.

## 2016-02-11 ENCOUNTER — Encounter: Payer: Self-pay | Admitting: *Deleted

## 2016-02-11 ENCOUNTER — Emergency Department
Admission: EM | Admit: 2016-02-11 | Discharge: 2016-02-11 | Disposition: A | Discharge status: Home / Self Care | Payer: BLUE CROSS/BLUE SHIELD | Source: Home / Self Care | Attending: Family Medicine | Admitting: Family Medicine

## 2016-02-11 DIAGNOSIS — L740 Miliaria rubra: Secondary | ICD-10-CM | POA: Diagnosis not present

## 2016-02-11 MED ORDER — TRIAMCINOLONE ACETONIDE 0.1 % EX CREA: 1.0000 "application " | TOPICAL_CREAM | Freq: Two times a day (BID) | CUTANEOUS | 0 refills | Status: DC

## 2016-02-11 NOTE — ED Triage Notes (Signed)
Patient reports red raised non- pruritic rash to abdomen and both sides of glute x yesterday. 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 No new clothing or detergents although does use borax at times and worries he did not remove all of it from the laundry.

## 2016-02-11 NOTE — ED Provider Notes (Signed)
[facility]    CSN: [id number] Arrival date & time: 02/11/16  1733     History   Chief Complaint Chief Complaint  Patient presents with  . Rash    HPI Don Dominguez is a 51 y.o. male.   Last night patient noticed a non-pruritic rash on his abdomen, and today had similar rash on his buttocks.  He feels well otherwise. 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  He notes that he has been sweating a lot at work.    Rash  Location: abdomen and buttocks. Quality: dryness and redness   Quality: not blistering, not bruising, not burning, not draining, not itchy, not painful, not peeling, not scaling, not swelling and not weeping   Severity:  Mild Onset quality:  Sudden Duration:  1 day Timing:  Constant Progression:  Unchanged Chronicity:  New Context: not animal contact, not chemical exposure, not exposure to similar rash, not food, not hot tub use, not insect bite/sting, not medications, not new detergent/soap, not nuts, not plant contact, not pollen, not sick contacts and not sun exposure   Relieved by:  None tried Worsened by:  Heat Ineffective treatments:  None tried Associated symptoms: no abdominal pain, no diarrhea, no fatigue, no fever, no headaches, no induration, no joint pain, no myalgias, no nausea, no sore throat, no URI and not wheezing     Past Medical History:  Diagnosis Date  . Asthma   . Environmental allergies     There are no active problems to display for this patient.   Past Surgical History:  Procedure Laterality Date  . HERNIA REPAIR    . SPLENECTOMY, TOTAL    . TONSILLECTOMY         Home Medications    Prior to Admission medications   Medication Sig Start Date End Date Taking? Authorizing Provider  cetirizine (ZYRTEC) 10 MG tablet Take 10 mg by mouth daily.    Historical Provider, MD  cyclobenzaprine (FLEXERIL) 10 MG tablet Take 1 tablet (10 mg total) by mouth at bedtime. For muscle  relaxant 11/23/15   Lajean Manesavid Massey, MD  ipratropium (ATROVENT) 0.06 % nasal spray Place 2 sprays into both nostrils 4 (four) times daily. 01/21/16   Junius FinnerErin O'Malley, PA-C  meloxicam (MOBIC) 7.5 MG tablet Take 1 twice a day as needed for pain. Take with food. (Do not take with any other NSAID.) 11/23/15   Lajean Manesavid Massey, MD  naproxen sodium (ANAPROX) 220 MG tablet Take 220 mg by mouth 2 (two) times daily with a meal.    Historical Provider, MD  triamcinolone cream (KENALOG) 0.1 % Apply 1 application topically 2 (two) times daily. 02/11/16   Don HawStephen A Beese, MD    Family History Family History  Problem Relation Age of Onset  . Cancer Other     lung  . Cancer Other     lung  . Heart attack Other   . Diabetes Mother   . Hypertension Mother   . Heart attack Father     Social History Social History  Substance Use Topics  . Smoking status: Never Smoker  . Smokeless tobacco: Never Used  . Alcohol use No     Allergies   Patient has no known allergies.   Review of Systems Review of Systems  Constitutional: Negative for fatigue and fever.  HENT: Negative for sore throat.   Respiratory: Negative for wheezing.   Gastrointestinal: Negative for abdominal pain, diarrhea and nausea.  Musculoskeletal:  Negative for arthralgias and myalgias.  Skin: Positive for rash.  Neurological: Negative for headaches.  All other systems reviewed and are negative.    Physical Exam Triage Vital Signs ED Triage Vitals  Enc Vitals Group     BP 02/11/16 1753 100/67     Pulse Rate 02/11/16 1753 77     Resp --      Temp 02/11/16 1753 98.1 F (36.7 C)     Temp Source 02/11/16 1753 Oral     SpO2 02/11/16 1753 99 %     Weight 02/11/16 1754 164 lb (74.4 kg)     Height --      Head Circumference --      Peak Flow --      Pain Score 02/11/16 1755 0     Pain Loc --      Pain Edu? --      Excl. in GC? --    No data found.   Updated Vital Signs BP 100/67 (BP Location: Left Arm)   Pulse 77   Temp 98.1  F (36.7 C) (Oral)   Wt 164 lb (74.4 kg)   SpO2 99%   BMI 22.87 kg/m   Visual Acuity Right Eye Distance:   Left Eye Distance:   Bilateral Distance:    Right Eye Near:   Left Eye Near:    Bilateral Near:     Physical Exam  Constitutional: He appears well-developed and well-nourished. No distress.  HENT:  Head: Normocephalic.  Right Ear: External ear normal.  Left Ear: External ear normal.  Nose: Nose normal.  Mouth/Throat: Oropharynx is clear and moist.  Eyes: Conjunctivae are normal. Pupils are equal, round, and reactive to light.  Neck: Neck supple.  Cardiovascular: Normal heart sounds.   Pulmonary/Chest: Breath sounds normal.  Abdominal: There is no tenderness.  Musculoskeletal: He exhibits no edema.  Lymphadenopathy:    He has no cervical adenopathy.  Neurological: He is alert.  Skin: Skin is warm and dry. Rash noted. Rash is macular. Rash is not pustular, not vesicular and not urticarial.     Buttocks and abdomen have multiple 2 to 4mm diameter slightly raised erythematous macules, not centered on follicles.  No tenderness to palpation.   Nursing note and vitals reviewed.    UC Treatments / Results  Labs (all labs ordered are listed, but only abnormal results are displayed) Labs Reviewed - No data to display  EKG  EKG Interpretation None       Radiology No results found.  Procedures Procedures (including critical care time)  Medications Ordered in UC Medications - No data to display   Initial Impression / Assessment and Plan / UC Course  I have reviewed the triage vital signs and the nursing notes.  Pertinent labs & imaging results that were available during my care of the patient were reviewed by me and considered in my medical decision making (see chart for details).  Clinical Course   Rx for triamcinolone 0.1% cream BID  Keep the affected area dry.  Do not use ointments or cr eams. These can make the condition worse.  Apply cool  compresses to the affected areas.  Do not scratch your skin.  Do not take hot showers or baths. Followup with dermatologist if not improved 6 days.    Final Clinical Impressions(s) / UC Diagnoses   Final diagnoses:  Heat rash    New Prescriptions New Prescriptions   TRIAMCINOLONE CREAM (KENALOG) 0.1 %    Apply 1 application  topically 2 (two) times daily.     Don Haw, MD 02/28/16 [phone]

## 2016-02-11 NOTE — Discharge Instructions (Signed)
Keep the affected area dry. Do not use ointments or cr eams. These can make the condition worse. Apply cool compresses to the affected areas. Do not scratch your skin. Do not take hot showers or baths.

## 2016-04-27 ENCOUNTER — Encounter (HOSPITAL_BASED_OUTPATIENT_CLINIC_OR_DEPARTMENT_OTHER): Payer: Self-pay

## 2016-04-27 ENCOUNTER — Emergency Department (HOSPITAL_BASED_OUTPATIENT_CLINIC_OR_DEPARTMENT_OTHER)
Admission: EM | Admit: 2016-04-27 | Discharge: 2016-04-27 | Disposition: A | Discharge status: Home / Self Care | Payer: Worker's Compensation | Attending: Emergency Medicine | Admitting: Emergency Medicine

## 2016-04-27 ENCOUNTER — Emergency Department (HOSPITAL_BASED_OUTPATIENT_CLINIC_OR_DEPARTMENT_OTHER): Payer: Worker's Compensation

## 2016-04-27 DIAGNOSIS — S0101XA Laceration without foreign body of scalp, initial encounter: Secondary | ICD-10-CM | POA: Diagnosis not present

## 2016-04-27 DIAGNOSIS — Z791 Long term (current) use of non-steroidal anti-inflammatories (NSAID): Secondary | ICD-10-CM | POA: Insufficient documentation

## 2016-04-27 DIAGNOSIS — J45909 Unspecified asthma, uncomplicated: Secondary | ICD-10-CM | POA: Insufficient documentation

## 2016-04-27 DIAGNOSIS — Y929 Unspecified place or not applicable: Secondary | ICD-10-CM | POA: Insufficient documentation

## 2016-04-27 DIAGNOSIS — S0990XA Unspecified injury of head, initial encounter: Secondary | ICD-10-CM

## 2016-04-27 DIAGNOSIS — W228XXA Striking against or struck by other objects, initial encounter: Secondary | ICD-10-CM | POA: Diagnosis not present

## 2016-04-27 DIAGNOSIS — S060X1A Concussion with loss of consciousness of 30 minutes or less, initial encounter: Secondary | ICD-10-CM | POA: Insufficient documentation

## 2016-04-27 DIAGNOSIS — Y9389 Activity, other specified: Secondary | ICD-10-CM | POA: Insufficient documentation

## 2016-04-27 DIAGNOSIS — Y990 Civilian activity done for income or pay: Secondary | ICD-10-CM | POA: Insufficient documentation

## 2016-04-27 NOTE — ED Provider Notes (Signed)
[facility] Provider Note   CSN: [id number] Arrival date & time: 04/27/16  0118     History   Chief Complaint Chief Complaint  Patient presents with  . Head Injury    HPI Don Dominguez is a 51 y.o. male.  HPI Patient presents emergency department after being noted by coworkers to be confused with trauma to his head.  Last thing he remembers he was standing on a pallet within the next thing he knew he was on the floor.  He denies neck pain.  No weakness in his arms or legs.  Denies chest pain abdominal pain.  His been his normal state health today.  This occurred while at work.  He is not on anticoagulants.  No significant headache at this time.  He presents with obvious hematoma laceration to his posterior scalp. no active bleeding at this time  Past Medical History:  Diagnosis Date  . Asthma   . Environmental allergies     There are no active problems to display for this patient.   Past Surgical History:  Procedure Laterality Date  . HERNIA REPAIR    . SPLENECTOMY, TOTAL    . TONSILLECTOMY         Home Medications    Prior to Admission medications   Medication Sig Start Date End Date Taking? Authorizing Provider  cetirizine (ZYRTEC) 10 MG tablet Take 10 mg by mouth daily.    Historical Provider, MD  cyclobenzaprine (FLEXERIL) 10 MG tablet Take 1 tablet (10 mg total) by mouth at bedtime. For muscle relaxant 11/23/15   Lajean Manes, MD  ipratropium (ATROVENT) 0.06 % nasal spray Place 2 sprays into both nostrils 4 (four) times daily. 01/21/16   Junius Finner, PA-C  meloxicam (MOBIC) 7.5 MG tablet Take 1 twice a day as needed for pain. Take with food. (Do not take with any other NSAID.) 11/23/15   Lajean Manes, MD  naproxen sodium (ANAPROX) 220 MG tablet Take 220 mg by mouth 2 (two) times daily with a meal.    Historical Provider, MD  triamcinolone cream (KENALOG) 0.1 % Apply 1 application topically 2 (two) times daily. 02/11/16   Lattie Haw, MD     Family History Family History  Problem Relation Age of Onset  . Cancer Other     lung  . Cancer Other     lung  . Heart attack Other   . Diabetes Mother   . Hypertension Mother   . Heart attack Father     Social History Social History  Substance Use Topics  . Smoking status: Never Smoker  . Smokeless tobacco: Never Used  . Alcohol use No     Allergies   Patient has no known allergies.   Review of Systems Review of Systems  All other systems reviewed and are negative.    Physical Exam Updated Vital Signs BP (!) 146/87 (BP Location: Right Arm)   Pulse 78   Temp 97.8 F (36.6 C) (Oral)   Resp 18   Ht 5\' 11"  (1.803 m)   Wt 175 lb (79.4 kg)   SpO2 100%   BMI 24.41 kg/m   Physical Exam  Constitutional: He is oriented to person, place, and time. He appears well-developed and well-nourished.  HENT:  Head: Normocephalic.  Posterior scalp laceration without active bleeding. small hematoma  Eyes: EOM are normal. Pupils are equal, round, and reactive to light.  Neck: Normal range of motion. Neck supple.  C-spine nontender  Cardiovascular: Normal rate, regular  rhythm, normal heart sounds and intact distal pulses.   Pulmonary/Chest: Effort normal and breath sounds normal. No respiratory distress.  Abdominal: Soft. He exhibits no distension. There is no tenderness.  Musculoskeletal: Normal range of motion.  Neurological: He is alert and oriented to person, place, and time.  5/5 strength in major muscle groups of  bilateral upper and lower extremities. Speech normal. No facial asymetry.   Skin: Skin is warm and dry.  Psychiatric: He has a normal mood and affect. Judgment normal.  Nursing note and vitals reviewed.    ED Treatments / Results  Labs (all labs ordered are listed, but only abnormal results are displayed) Labs Reviewed - No data to display  EKG  EKG Interpretation None       Radiology Ct Head Wo Contrast  Result Date: 04/27/2016 CLINICAL  DATA:  Status post fall while at work. Syncope and memory loss. Left hand tingling. Left posterior parietal laceration. Initial encounter. EXAM: CT HEAD WITHOUT CONTRAST TECHNIQUE: Contiguous axial images were obtained from the base of the skull through the vertex without intravenous contrast. COMPARISON:  None. FINDINGS: Brain: No evidence of acute infarction, hemorrhage, hydrocephalus, extra-axial collection or mass lesion/mass effect. Prominence of the sulci suggests mild cortical volume loss. The brainstem and fourth ventricle are within normal limits. The basal ganglia are unremarkable in appearance. The cerebral hemispheres demonstrate grossly normal gray-white differentiation. No mass effect or midline shift is seen. Vascular: No hyperdense vessel or unexpected calcification. Skull: There is no evidence of fracture; visualized osseous structures are unremarkable in appearance. Sinuses/Orbits: The orbits are within normal limits. The paranasal sinuses and mastoid air cells are well-aerated. Other: A soft tissue laceration is noted overlying the high left posterior parietal calvarium. IMPRESSION: 1. No evidence of traumatic intracranial injury or fracture. 2. Soft tissue laceration overlying the high left posterior parietal calvarium. 3. Mild cortical volume loss. Electronically Signed   By: Roanna RaiderJeffery  Chang M.D.   On: 04/27/2016 02:15     +++++++++++++++++++++++++++++++++++++++++++++++++  Procedures .Marland Kitchen.Laceration Repair Performed by: Azalia BilisAMPOS, Deyona Soza Authorized by: Azalia BilisAMPOS, Levenia Skalicky    Consent: Verbal consent obtained. Risks and benefits: risks, benefits and alternatives were discussed Patient identity confirmed: provided demographic data Time out performed prior to procedure Prepped and Draped in normal sterile fashion Wound explored Laceration Location: Posterior scalp Laceration Length: 5 cm No Foreign Bodies seen or palpated Anesthesia: None  Irrigation method: syringe Amount of cleaning:  standard Skin closure: Staples Number of sutures or staples: 6  Technique: Staples  Patient tolerance: Patient tolerated the procedure well with no immediate complications.  +++++++++++++++++++++++++++++++++++++++++++++++++++++    Medications Ordered in ED Medications - No data to display   Initial Impression / Assessment and Plan / ED Course  I have reviewed the triage vital signs and the nursing notes.  Pertinent labs & imaging results that were available during my care of the patient were reviewed by me and considered in my medical decision making (see chart for details).     Head CT negative.  Laceration repaired.  C-spine nontender.  Mild concussive symptoms.  Discharge home in good condition.  Final Clinical Impressions(s) / ED Diagnoses   Final diagnoses:  Injury of head, initial encounter  Concussion with loss of consciousness of 30 minutes or less, initial encounter  Laceration of scalp, initial encounter    New Prescriptions New Prescriptions   No medications on file     Azalia BilisKevin Myleka Moncure, MD 04/27/16 [phone]

## 2016-04-27 NOTE — ED Notes (Signed)
Pt verbalizes understanding of d/c instructions and denies any further needs at this time. 

## 2016-04-27 NOTE — Discharge Instructions (Signed)
Staple Removal in 10 days

## 2016-04-27 NOTE — ED Triage Notes (Signed)
Pt comes in from work where he hit his head.  Pt is not clear as to if he passed out prior to hitting head or hit his head and then passed out.  Pt cannot remember the episode.  He is also having difficulty with orientation questions as to what year it is and who the president is.  Pt also c/o tingling to left hand.  No facial droop and bilateral grip strength is equal.  Pt has a vertical laceration to the back of his head that is approximately 4 inches long.  Dressing applied in triage and bleeding controlled.

## 2016-04-29 ENCOUNTER — Emergency Department (INDEPENDENT_AMBULATORY_CARE_PROVIDER_SITE_OTHER)
Admission: EM | Admit: 2016-04-29 | Discharge: 2016-04-29 | Disposition: A | Discharge status: Home / Self Care | Payer: BLUE CROSS/BLUE SHIELD | Source: Home / Self Care | Attending: Family Medicine | Admitting: Family Medicine

## 2016-04-29 DIAGNOSIS — Z026 Encounter for examination for insurance purposes: Secondary | ICD-10-CM

## 2016-04-29 DIAGNOSIS — S060X1D Concussion with loss of consciousness of 30 minutes or less, subsequent encounter: Secondary | ICD-10-CM

## 2016-04-29 DIAGNOSIS — R110 Nausea: Secondary | ICD-10-CM

## 2016-04-29 DIAGNOSIS — R42 Dizziness and giddiness: Secondary | ICD-10-CM

## 2016-04-29 MED ORDER — MECLIZINE HCL 25 MG PO TABS: 25.0000 mg | ORAL_TABLET | Freq: Three times a day (TID) | ORAL | 0 refills | Status: DC | PRN

## 2016-04-29 MED ORDER — ONDANSETRON 4 MG PO TBDP
ORAL_TABLET | ORAL | 0 refills | Status: DC
Start: 2016-04-29 — End: 2017-08-29

## 2016-04-29 NOTE — ED Provider Notes (Signed)
CSN: [id number]     Arrival date & time 04/29/16  1601 History   First MD Initiated Contact with Patient 04/29/16 1615     Chief Complaint  Patient presents with  . Headache    Follow up head injury 04/27/16   (Consider location/radiation/quality/duration/timing/severity/associated sxs/prior Treatment) HPI Don Dominguez is a 51 y.o. male presenting to UC with c/o intermittent nausea, dizziness, fatigue, and generalized headache following a head injury that occurred at work at [employer] on 04/27/16.  Pt was [facility] and had a normal head CT.  He also had 6 staples placed in the back of his head from the head injury.  He was advised to call his PCP Monday for f/u but notes his work note expires tomorrow. He is concerned he is still having dizziness and does not feel it would be safe for him to return to work tomorrow night.  Denies prior hx of head injuries or concussions.  Denies vomiting or change in balance.     Past Medical History:  Diagnosis Date  . Asthma   . Environmental allergies    Past Surgical History:  Procedure Laterality Date  . HERNIA REPAIR    . SPLENECTOMY, TOTAL    . TONSILLECTOMY     Family History  Problem Relation Age of Onset  . Cancer Other     lung  . Cancer Other     lung  . Heart attack Other   . Diabetes Mother   . Hypertension Mother   . Heart attack Father    Social History  Substance Use Topics  . Smoking status: Never Smoker  . Smokeless tobacco: Never Used  . Alcohol use No    Review of Systems  Constitutional: Positive for fatigue. Negative for diaphoresis and fever.  Eyes: Negative for photophobia, pain and visual disturbance.  Gastrointestinal: Positive for nausea. Negative for abdominal pain, diarrhea and vomiting.  Musculoskeletal: Negative for back pain, myalgias, neck pain and neck stiffness.  Skin: Negative for color change and wound.  Neurological: Positive for dizziness,  light-headedness and headaches. Negative for seizures, syncope and weakness.    Allergies  Patient has no known allergies.  Home Medications   Prior to Admission medications   Medication Sig Start Date End Date Taking? Authorizing Provider  cetirizine (ZYRTEC) 10 MG tablet Take 10 mg by mouth daily.   Yes Historical Provider, MD  cyclobenzaprine (FLEXERIL) 10 MG tablet Take 1 tablet (10 mg total) by mouth at bedtime. For muscle relaxant 11/23/15  Yes Lajean Manesavid Massey, MD  ipratropium (ATROVENT) 0.06 % nasal spray Place 2 sprays into both nostrils 4 (four) times daily. 01/21/16  Yes Junius FinnerErin O'Malley, PA-C  meloxicam (MOBIC) 7.5 MG tablet Take 1 twice a day as needed for pain. Take with food. (Do not take with any other NSAID.) 11/23/15  Yes Lajean Manesavid Massey, MD  naproxen sodium (ANAPROX) 220 MG tablet Take 220 mg by mouth 2 (two) times daily with a meal.   Yes Historical Provider, MD  triamcinolone cream (KENALOG) 0.1 % Apply 1 application topically 2 (two) times daily. 02/11/16  Yes Lattie HawStephen A Beese, MD  meclizine (ANTIVERT) 25 MG tablet Take 1 tablet (25 mg total) by mouth 3 (three) times daily as needed for dizziness. 04/29/16   Junius FinnerErin O'Malley, PA-C  ondansetron (ZOFRAN ODT) 4 MG disintegrating tablet 4mg  ODT q4 hours prn nausea/vomit 04/29/16   Junius FinnerErin O'Malley, PA-C   Meds Ordered and Administered this Visit  Medications -  No data to display  BP (!) 147/73 (BP Location: Left Arm)   Pulse 86   Temp 97.7 F (36.5 C) (Oral)   Ht 5\' 11"  (1.803 m)   Wt 169 lb (76.7 kg)   SpO2 98%   BMI 23.57 kg/m  No data found.   Physical Exam  Constitutional: He is oriented to person, place, and time. He appears well-developed and well-nourished. No distress.  HENT:  Head: Normocephalic.  Right Ear: Tympanic membrane normal.  Left Ear: Tympanic membrane normal.  Nose: Nose normal.  Mouth/Throat: Uvula is midline, oropharynx is clear and moist and mucous membranes are normal.  Posterior scalp: laceration  appears to be healing well. Wound is clean and dry. Six staples in place.   Eyes: Pupils are equal, round, and reactive to light. Right eye exhibits nystagmus. Left eye exhibits nystagmus.  Slight horizontal nystagmus to the Right  Neck: Normal range of motion.  Cardiovascular: Normal rate and regular rhythm.   Pulmonary/Chest: Effort normal. No respiratory distress.  Musculoskeletal: Normal range of motion.  Neurological: He is alert and oriented to person, place, and time. No cranial nerve deficit.  Speech is clear, alert to person, place and time. Normal gait.   Skin: Skin is warm and dry. He is not diaphoretic.  Psychiatric: He has a normal mood and affect. His behavior is normal.  Nursing note and vitals reviewed.   Urgent Care Course     Procedures (including critical care time)  Labs Review Labs Reviewed - No data to display  Imaging Review No results found.   MDM   1. Concussion with loss of consciousness of 30 minutes or less, subsequent encounter   2. Encounter related to worker's compensation claim   3. Nausea   4. Vertigo    Pt presenting 2 days after head injury at work with symptoms c/w concussion. Laceration appears to be healing well.  Staples should remain in place another 5-7 days as recommended by ED (10 days)   Work note given for tomorrow Rx: Meclizine for vertigo, Zofran for nausea if not having dizziness  Strongly recommend f/u with Employee/Occupational Health at [employer] tomorrow for further evaluation and continued treatment for work-related injury Home care instructions provided. Patient verbalized understanding and agreement with treatment plan.    Junius Finner, PA-C 04/29/16 1702    Junius Finner, PA-C 04/29/16 [phone]

## 2016-04-29 NOTE — Discharge Instructions (Signed)
°  Meclizine (antivert) is a medication used for vertigo (dizziness), nausea and vomiting. It can cause fatigue.  Do not drive or operate heavy machinery while taking.  Zofran (ondansetron) is an anti-nausea medication that can be taken for nausea. This medication should not cause drowsiness.     You should take one or there other of these medications for your nausea. Do not take at the same time.

## 2016-04-29 NOTE — ED Triage Notes (Signed)
Patient states that he feels that he needs to be out of work an additional day due to a injury on 04/27/16. He states that he does still have a dull headache and  Some dizziness. Reports some nausea and vomiting. Says he would feel better with some additional time off due to the dizziness and the nature of his work to keep himself and his co-workers safe, does not want another incident.

## 2016-04-29 NOTE — ED Notes (Signed)
Patient denies pain and is resting comfortably.  

## 2017-08-29 ENCOUNTER — Other Ambulatory Visit: Payer: Self-pay

## 2017-08-29 ENCOUNTER — Encounter: Payer: Self-pay | Admitting: Emergency Medicine

## 2017-08-29 ENCOUNTER — Emergency Department
Admission: EM | Admit: 2017-08-29 | Discharge: 2017-08-29 | Disposition: A | Discharge status: Home / Self Care | Payer: BLUE CROSS/BLUE SHIELD | Source: Home / Self Care | Attending: Family Medicine | Admitting: Family Medicine

## 2017-08-29 DIAGNOSIS — B9789 Other viral agents as the cause of diseases classified elsewhere: Secondary | ICD-10-CM | POA: Diagnosis not present

## 2017-08-29 DIAGNOSIS — J4521 Mild intermittent asthma with (acute) exacerbation: Secondary | ICD-10-CM

## 2017-08-29 DIAGNOSIS — J069 Acute upper respiratory infection, unspecified: Secondary | ICD-10-CM

## 2017-08-29 MED ORDER — ALBUTEROL SULFATE HFA 108 (90 BASE) MCG/ACT IN AERS: 2.0000 | INHALATION_SPRAY | RESPIRATORY_TRACT | 0 refills | Status: DC | PRN

## 2017-08-29 MED ORDER — PREDNISONE 20 MG PO TABS: ORAL_TABLET | ORAL | 0 refills | Status: DC

## 2017-08-29 NOTE — ED Provider Notes (Signed)
[facility]    CSN: [id number] Arrival date & time: 08/29/17  1552     History   Chief Complaint Chief Complaint  Patient presents with  . Shortness of Breath    HPI Don Dominguez is a 52 y.o. male.   Two days ago patient began to feel tight in his anterior chest with inspiration, followed by a scratchy throat and mild sinus congestion. Last night he developed a mild cough, fatigue, myalgias, and low grade fever to 99.  He has felt shortness of breath with mild wheezing, improved with his albuterol inhaler.  He has Advair on hand, but has not needed to use it recently.  The history is provided by the patient.    Past Medical History:  Diagnosis Date  . Asthma   . Environmental allergies     There are no active problems to display for this patient.   Past Surgical History:  Procedure Laterality Date  . HERNIA REPAIR    . SPLENECTOMY, TOTAL    . TONSILLECTOMY         Home Medications    Prior to Admission medications   Medication Sig Start Date End Date Taking? Authorizing Provider  albuterol (PROVENTIL HFA;VENTOLIN HFA) 108 (90 Base) MCG/ACT inhaler Inhale 2 puffs into the lungs every 4 (four) hours as needed for wheezing or shortness of breath. 08/29/17   Lattie Haw, MD  cetirizine (ZYRTEC) 10 MG tablet Take 10 mg by mouth daily.    [provider]  ipratropium (ATROVENT) 0.06 % nasal spray Place 2 sprays into both nostrils 4 (four) times daily. 01/21/16   Lurene Shadow, PA-C  naproxen sodium (ANAPROX) 220 MG tablet Take 220 mg by mouth 2 (two) times daily with a meal.    [provider]  predniSONE (DELTASONE) 20 MG tablet Take one tab by mouth twice daily for 4 days, then one daily. Take with food. 08/29/17   Lattie Haw, MD  famotidine (PEPCID) 10 MG tablet Take 10 mg by mouth 2 (two) times daily.  06/11/15  [provider]  Isosorbide Mononitrate (MONOKET PO) Take by mouth.  06/11/15  [provider]     Family History Family History  Problem Relation Age of Onset  . Cancer Other        lung  . Cancer Other        lung  . Heart attack Other   . Diabetes Mother   . Hypertension Mother   . Heart attack Father     Social History Social History   Tobacco Use  . Smoking status: Never Smoker  . Smokeless tobacco: Never Used  Substance Use Topics  . Alcohol use: No  . Drug use: No     Allergies   Patient has no known allergies.   Review of Systems Review of Systems + sore throat + cough No pleuritic pain, but feels tight in anterior chest + wheezing + nasal congestion + post-nasal drainage No sinus pain/pressure No itchy/red eyes No earache No hemoptysis + SOB + fever, + chills No nausea No vomiting No abdominal pain No diarrhea No urinary symptoms No skin rash + fatigue + myalgias + headache Used OTC meds without relief   Physical Exam Triage Vital Signs ED Triage Vitals  Enc Vitals Group     BP 08/29/17 1612 112/66     Pulse Rate 08/29/17 1612 85     Resp --      Temp 08/29/17 1612 97.9 F (  36.6 C)     Temp Source 08/29/17 1612 Oral     SpO2 08/29/17 1612 98 %     Weight 08/29/17 1613 175 lb (79.4 kg)     Height 08/29/17 1613 5\' 11"  (1.803 m)     Head Circumference --      Peak Flow --      Pain Score 08/29/17 1612 1     Pain Loc --      Pain Edu? --      Excl. in GC? --    No data found.  Updated Vital Signs BP 112/66 (BP Location: Right Arm)   Pulse 85   Temp 97.9 F (36.6 C) (Oral)   Ht 5\' 11"  (1.803 m)   Wt 175 lb (79.4 kg)   SpO2 98%   BMI 24.41 kg/m   Visual Acuity Right Eye Distance:   Left Eye Distance:   Bilateral Distance:    Right Eye Near:   Left Eye Near:    Bilateral Near:     Physical Exam Nursing notes and Vital Signs reviewed. Appearance:  Patient appears stated age, and in no acute distress Eyes:  Pupils are equal, round, and reactive to light and accomodation.  Extraocular movement is intact.   Conjunctivae are not inflamed  Ears:  Canals normal.  Tympanic membranes normal.  Nose:  Mildly congested turbinates.  No sinus tenderness.  Pharynx:  Normal Neck:  Supple.  Enlarged posterior/lateral nodes are palpated bilaterally, tender to palpation on the left.   Lungs:  Clear to auscultation.  Breath sounds are equal.  Moving air well. Heart:  Regular rate and rhythm without murmurs, rubs, or gallops.  Abdomen:  Nontender without masses or hepatosplenomegaly.  Bowel sounds are present.  No CVA or flank tenderness.  Extremities:  No edema.  Skin:  No rash present.    UC Treatments / Results  Labs (all labs ordered are listed, but only abnormal results are displayed) Labs Reviewed - No data to display  EKG None  Radiology No results found.  Procedures Procedures (including critical care time)  Medications Ordered in UC Medications - No data to display  Initial Impression / Assessment and Plan / UC Course  I have reviewed the triage vital signs and the nursing notes.  Pertinent labs & imaging results that were available during my care of the patient were reviewed by me and considered in my medical decision making (see chart for details).    There is no evidence of bacterial infection today.  Treat symptomatically for now  Begin prednisone burst/taper. Refill albuterol inhaler. Followup with Family Doctor if not improved in 7 to 10 days.   Final Clinical Impressions(s) / UC Diagnoses   Final diagnoses:  Viral URI with cough  Mild intermittent asthma with exacerbation     Discharge Instructions     Take plain guaifenesin (1200mg  extended release tabs such as Mucinex) twice daily, with plenty of water, for cough and congestion.  May add Pseudoephedrine (30mg , one or two every 4 to 6 hours) for sinus congestion.  Get adequate rest.   May use Afrin nasal spray (or generic oxymetazoline) each morning for about 5 days and then discontinue.  Also recommend using saline  nasal spray several times daily and saline nasal irrigation (AYR is a common brand).  Use Flonase or Atrovent nasal spray each morning after using Afrin nasal spray and saline nasal irrigation. Try warm salt water gargles for sore throat.  Stop all antihistamines for now,  and other non-prescription cough/cold preparations. May take Delsym Cough Suppressant at bedtime for nighttime cough.  Continue albuterol inhaler as needed. Resume Advair inhaler as prescribed.      ED Prescriptions    Medication Sig Dispense Auth. Provider   predniSONE (DELTASONE) 20 MG tablet Take one tab by mouth twice daily for 4 days, then one daily. Take with food. 12 tablet Lattie Haw, MD   albuterol (PROVENTIL HFA;VENTOLIN HFA) 108 (90 Base) MCG/ACT inhaler Inhale 2 puffs into the lungs every 4 (four) hours as needed for wheezing or shortness of breath. 1 Inhaler Lattie Haw, MD         Lattie Haw, MD 08/29/17 [phone]

## 2017-08-29 NOTE — ED Triage Notes (Signed)
Shortness of breath x 3 days, coughed up some mucus last night, belching, and feeling gassy

## 2017-08-29 NOTE — Discharge Instructions (Addendum)
Take plain guaifenesin (1200mg  extended release tabs such as Mucinex) twice daily, with plenty of water, for cough and congestion.  May add Pseudoephedrine (30mg , one or two every 4 to 6 hours) for sinus congestion.  Get adequate rest.   May use Afrin nasal spray (or generic oxymetazoline) each morning for about 5 days and then discontinue.  Also recommend using saline nasal spray several times daily and saline nasal irrigation (AYR is a common brand).  Use Flonase or Atrovent nasal spray each morning after using Afrin nasal spray and saline nasal irrigation. Try warm salt water gargles for sore throat.  Stop all antihistamines for now, and other non-prescription cough/cold preparations. May take Delsym Cough Suppressant at bedtime for nighttime cough.  Continue albuterol inhaler as needed. Resume Advair inhaler as prescribed.

## 2018-01-12 ENCOUNTER — Other Ambulatory Visit: Payer: Self-pay

## 2018-01-12 ENCOUNTER — Emergency Department
Admission: EM | Admit: 2018-01-12 | Discharge: 2018-01-12 | Disposition: A | Discharge status: Home / Self Care | Payer: BLUE CROSS/BLUE SHIELD | Source: Home / Self Care | Attending: Family Medicine | Admitting: Family Medicine

## 2018-01-12 ENCOUNTER — Encounter: Payer: Self-pay | Admitting: *Deleted

## 2018-01-12 DIAGNOSIS — J069 Acute upper respiratory infection, unspecified: Secondary | ICD-10-CM

## 2018-01-12 DIAGNOSIS — B9789 Other viral agents as the cause of diseases classified elsewhere: Secondary | ICD-10-CM

## 2018-01-12 DIAGNOSIS — J4521 Mild intermittent asthma with (acute) exacerbation: Secondary | ICD-10-CM

## 2018-01-12 MED ORDER — DOXYCYCLINE HYCLATE 100 MG PO CAPS: 100.0000 mg | ORAL_CAPSULE | Freq: Two times a day (BID) | ORAL | 0 refills | Status: DC

## 2018-01-12 MED ORDER — ALBUTEROL SULFATE HFA 108 (90 BASE) MCG/ACT IN AERS: 2.0000 | INHALATION_SPRAY | RESPIRATORY_TRACT | 1 refills | Status: DC | PRN

## 2018-01-12 MED ORDER — METHYLPREDNISOLONE SODIUM SUCC 125 MG IJ SOLR
80.0000 mg | Freq: Once | INTRAMUSCULAR | Status: AC
  Administered 2018-01-12: 80 mg via INTRAMUSCULAR

## 2018-01-12 MED ORDER — GUAIFENESIN-CODEINE 100-10 MG/5ML PO SOLN: ORAL | 0 refills | Status: DC

## 2018-01-12 MED ORDER — PREDNISONE 20 MG PO TABS: ORAL_TABLET | ORAL | 0 refills | Status: DC

## 2018-01-12 NOTE — Discharge Instructions (Addendum)
Begin prednisone Monday 01/13/18. Take plain guaifenesin (1200mg  extended release tabs such as Mucinex) twice daily, with plenty of water, for cough and congestion.  May add Pseudoephedrine (30mg , one or two every 4 to 6 hours) for sinus congestion.  Get adequate rest.   May use Afrin nasal spray (or generic oxymetazoline) each morning for about 5 days and then discontinue.  Also recommend using saline nasal spray several times daily and saline nasal irrigation (AYR is a common brand).    Try warm salt water gargles for sore throat.  Continue albuterol inhaler as prescribed. Stop all antihistamines for now, and other non-prescription cough/cold preparations. Begin Doxycycline if not improving about one week or if persistent fever develops    Discuss with your doctor an "asthma plan."

## 2018-01-12 NOTE — ED Triage Notes (Signed)
Patient c/o 2 weeks of cough, nasal congestion and fatigue. Taken Sudafed and Mucinex otc. Afebrile. Rec'd flu vac this season.

## 2018-01-12 NOTE — ED Provider Notes (Signed)
[phone] [address] [address] AG>TAG>TEXTTAG>[id number]Music [id number]  [id number]Music [id number]  [id number]Music [id number]  [id number] [id number]  [id number]Music [id number]  [id number]  Peak Flow --      Pain Score 01/12/18 1135 0     Pain Loc --      Pain Edu? --      Excl. in GC? --    No data found.  Updated Vital Signs BP 139/73 (BP Location: Right Arm)   Pulse 73   Temp 98 F (36.7 C) (Oral)   Resp 16   Wt 79.4 kg   SpO2 98%   BMI 24.41 kg/m   Visual Acuity Right Eye Distance:   Left Eye Distance:   Bilateral Distance:    Right Eye Near:   Left Eye Near:    Bilateral Near:     Physical Exam Nursing notes and Vital Signs reviewed. Appearance:  Patient appears stated age, and in no acute distress Eyes:  Pupils are equal, round, and reactive to light and accomodation.  Extraocular movement is intact.  Conjunctivae are not inflamed  Ears:  Canals normal.  Tympanic membranes normal.  Nose:  Mildly congested turbinates.  No sinus tenderness.  Pharynx:  Normal Neck:  Supple.  Enlarged non-tender posterior/lateral nodes are palpated bilaterally.  Lungs:   Faint scattered wheezes  posteriorly.  Breath sounds are equal.  Moving air well. Heart:  Regular rate and rhythm without murmurs, rubs, or gallops.  Abdomen:  Nontender without masses or hepatosplenomegaly.  Bowel sounds are present.  No CVA or flank tenderness.  Extremities:  No edema.  Skin:  No rash present.    UC Treatments / Results  Labs (all labs ordered are listed, but only abnormal results are displayed) Labs Reviewed - No data to display  EKG None  Radiology No results found.  Procedures Procedures (including critical care time)  Medications Ordered in UC Medications  methylPREDNISolone sodium succinate (SOLU-MEDROL) 125 mg/2 mL injection 80 mg (80 mg Intramuscular Given 01/12/18 1229)    Initial Impression / Assessment and Plan / UC Course  I have reviewed the triage vital signs and the nursing notes.  Pertinent labs & imaging results that were available during my care of the patient were reviewed by me and considered in my medical decision making (see chart for details).    There is no evidence of bacterial infection today.  Treat symptomatically for now. Administered Solumedrol 80mg  IM.  Begin prednisone burst/taper. Rx for Robitussin AC for night time cough.  Controlled Substance Prescriptions I have consulted the [place] Controlled Substances Registry for this patient, and feel the risk/benefit ratio today is favorable for proceeding with this prescription for a controlled substance.   Refill albuterol MDI. Followup with Family Doctor if not improved in about 8 days.   Final Clinical Impressions(s) / UC Diagnoses   Final diagnoses:  Viral URI with cough  Mild intermittent asthma with acute exacerbation     Discharge Instructions     Begin prednisone Monday 01/13/18. Take plain guaifenesin (1200mg  extended release tabs such as Mucinex) twice daily, with plenty of water, for cough and congestion.  May add Pseudoephedrine (30mg , one or two every 4 to 6 hours) for sinus congestion.   Get adequate rest.   May use Afrin nasal spray (or generic oxymetazoline) each morning for about 5 days and then discontinue.  Also recommend using saline nasal spray several times daily and saline nasal irrigation (AYR is a common brand).    Try warm salt water gargles for sore throat.  Continue albuterol inhaler as prescribed. Stop all antihistamines for now, and other non-prescription cough/cold preparations. Begin Doxycycline if not  improving about one week or if persistent fever develops (Given a prescription to hold, with an expiration date)    Discuss with your doctor an "asthma plan."    ED Prescriptions    Medication Sig Dispense Auth. Provider   albuterol (PROVENTIL HFA;VENTOLIN HFA) 108 (90 Base) MCG/ACT inhaler Inhale 2 puffs into the lungs every 4 (four) hours as needed for wheezing or shortness of breath. 1 Inhaler Lattie Haw, MD   predniSONE (DELTASONE) 20 MG tablet Take one tab by mouth twice daily for 4 days, then one daily for 3 days. Take with food. 11 tablet Lattie Haw, MD   guaiFENesin-codeine 100-10 MG/5ML syrup Take 10mL by mouth at bedtime as needed for cough.  May repeat dose in 4 to 6 hours. 100 mL Lattie Haw, MD        Lattie Haw, MD 01/14/18 [phone]

## 2018-07-16 IMAGING — CT CT HEAD W/O CM
3 series · 14 of 47 positions shown, 16 images · non-contrast
Comparison: None.

CLINICAL DATA: Status post fall while at work. Syncope and memory
loss. Left hand tingling. Left posterior parietal laceration.
Initial encounter.

EXAM:
CT HEAD WITHOUT CONTRAST
TECHNIQUE: Contiguous axial images were obtained from the base of the skull
through the vertex without intravenous contrast.

[Series 2: head wo · axial · 0.45mm/px · z∈[-161,-36]mm · 8 of 31 slices shown, 10 images]
[im 3/31  brain]
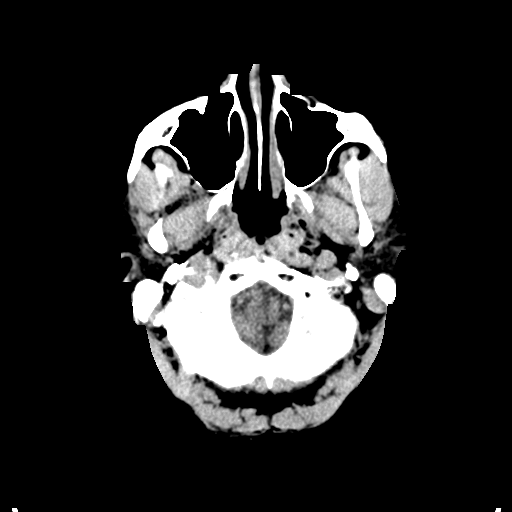
[im 3/31  bone]
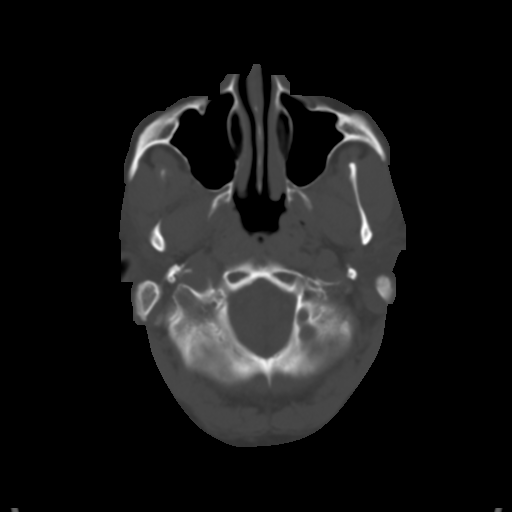
[im 7/31  brain]
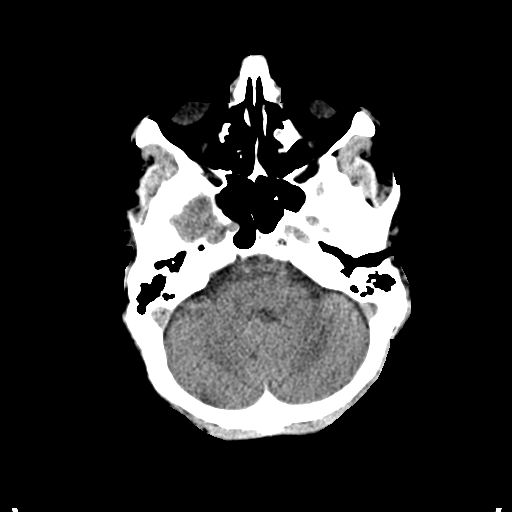
[im 10/31  brain]
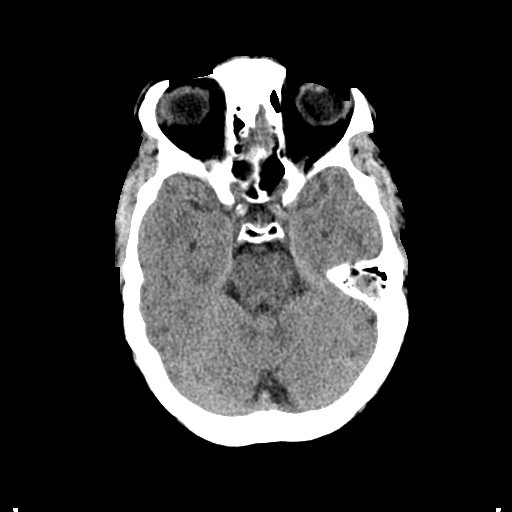
[im 14/31  brain]
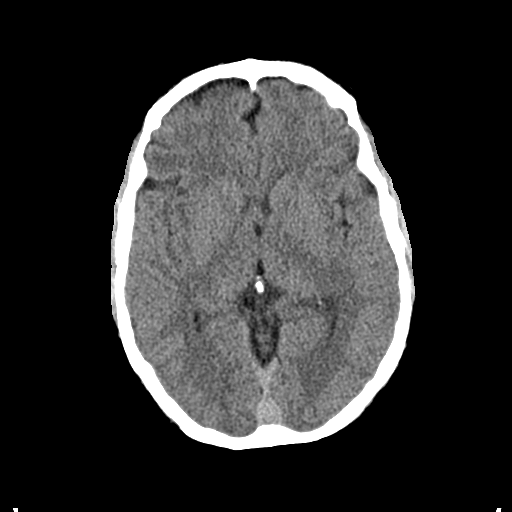
[im 17/31  brain]
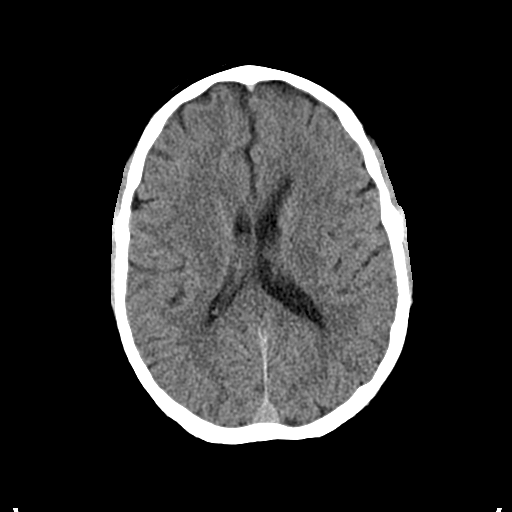
[im 17/31  bone]
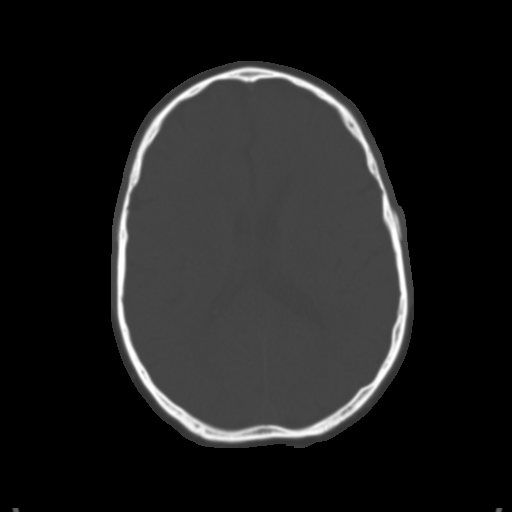
[im 21/31  brain]
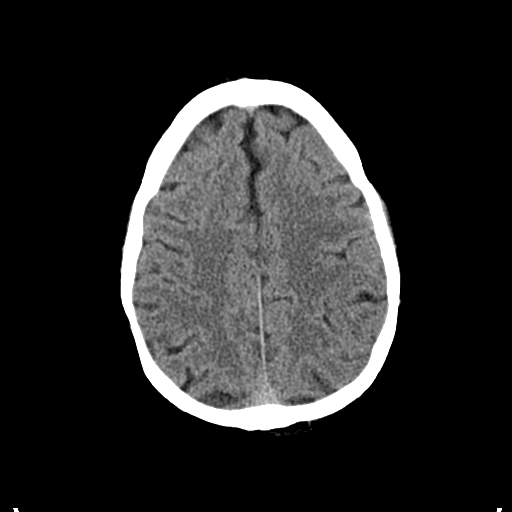
[im 24/31  brain]
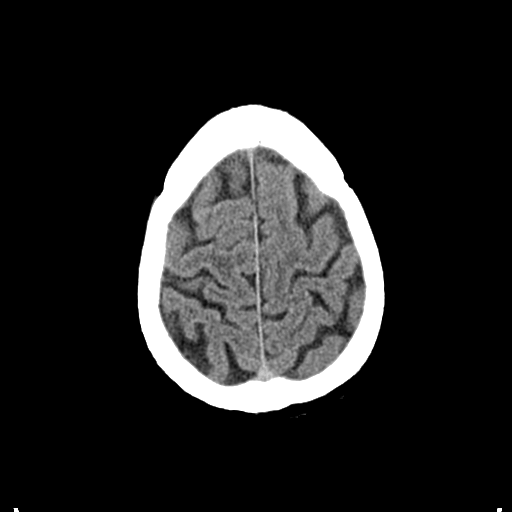
[im 28/31  brain]
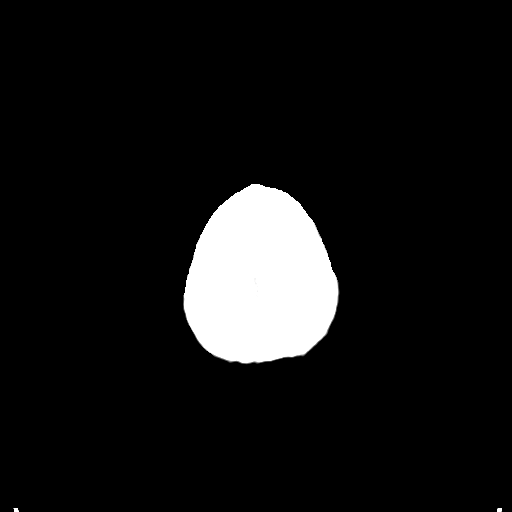

[Series 4: cor soft · coronal · 0.29mm/px · 3 of 63 slices shown]
[im 21/63  brain]
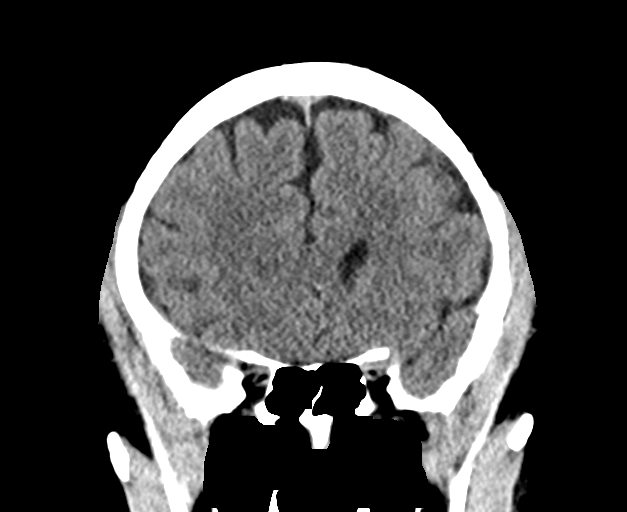
[im 28/63  brain]
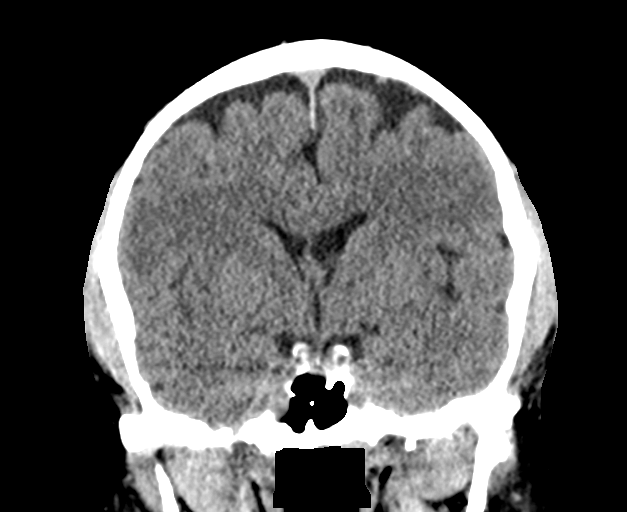
[im 35/63  brain]
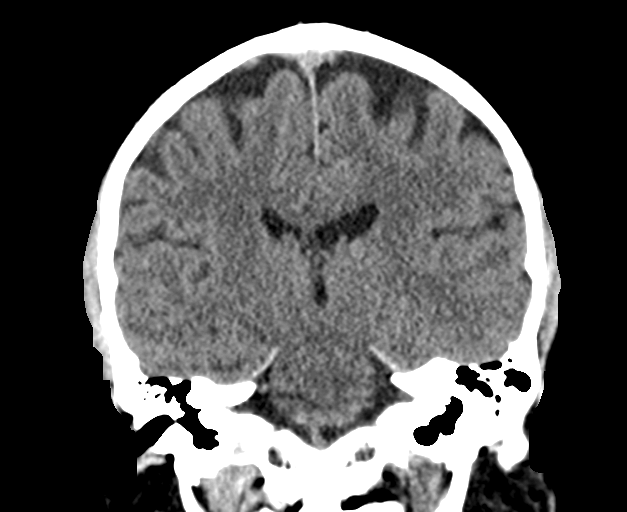

[Series 5: sag soft · sagittal · 0.30mm/px · 3 of 53 slices shown]
[im 18/53  brain]
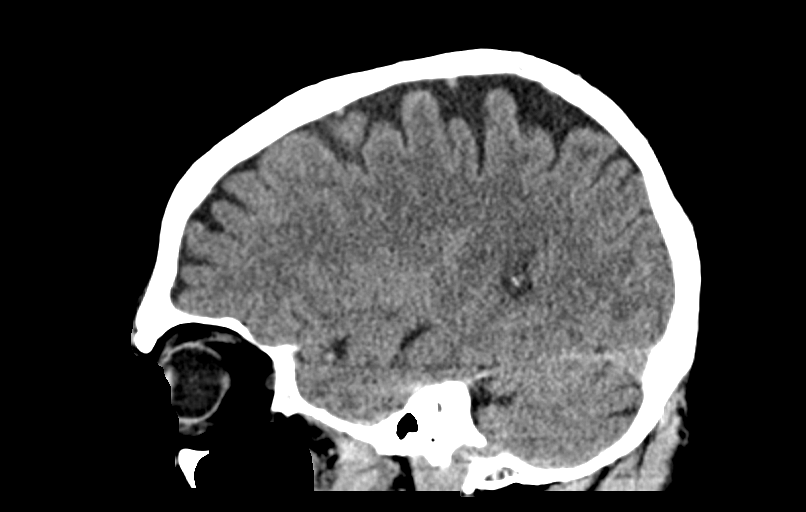
[im 27/53  brain]
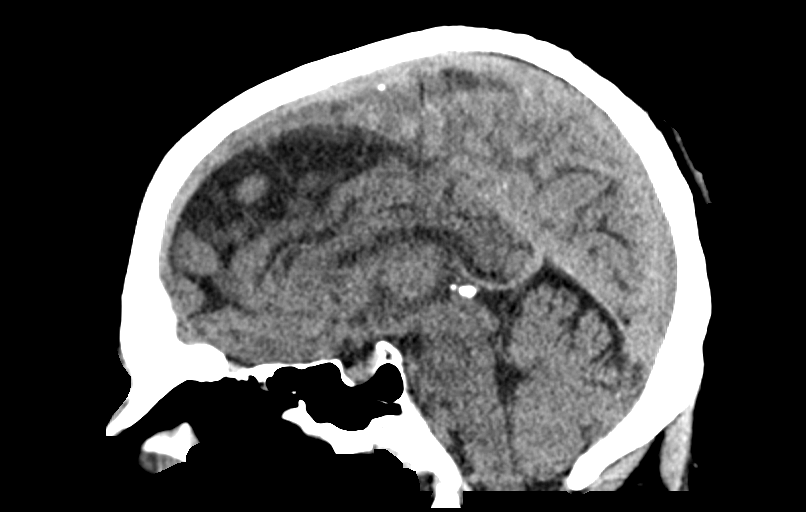
[im 35/53  brain]
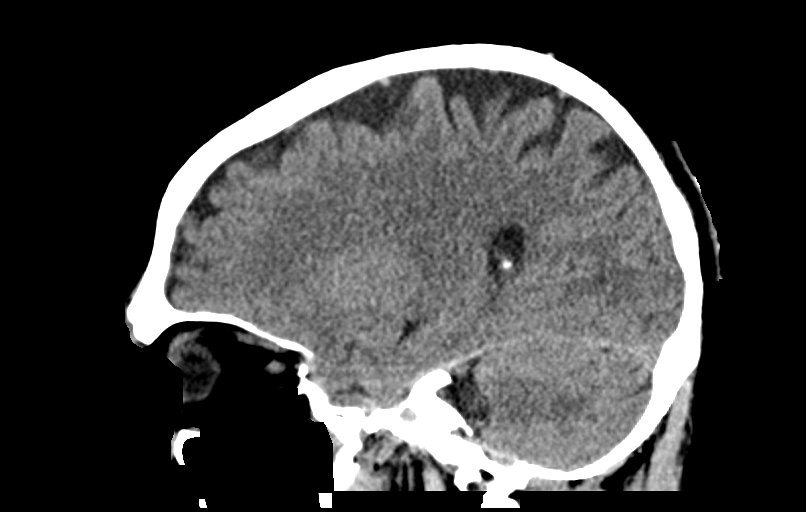

[14 of 47 positions shown; findings below may reference images not displayed]

FINDINGS: Brain: No evidence of acute infarction, hemorrhage, hydrocephalus,
extra-axial collection or mass lesion/mass effect.

Prominence of the sulci suggests mild cortical volume loss.

The brainstem and fourth ventricle are within normal limits. The
basal ganglia are unremarkable in appearance. The cerebral
hemispheres demonstrate grossly normal gray-white differentiation.
No mass effect or midline shift is seen.

Vascular: No hyperdense vessel or unexpected calcification.

Skull: There is no evidence of fracture; visualized osseous
structures are unremarkable in appearance.

Sinuses/Orbits: The orbits are within normal limits. The paranasal
sinuses and mastoid air cells are well-aerated.

Other: A soft tissue laceration is noted overlying the high left
posterior parietal calvarium.
IMPRESSION: 1. No evidence of traumatic intracranial injury or fracture.
2. Soft tissue laceration overlying the high left posterior parietal
calvarium.
3. Mild cortical volume loss.

## 2019-01-06 DIAGNOSIS — J1282 Pneumonia due to coronavirus disease 2019: Secondary | ICD-10-CM

## 2019-01-06 DIAGNOSIS — U071 COVID-19: Secondary | ICD-10-CM

## 2019-01-06 HISTORY — DX: Pneumonia due to coronavirus disease 2019: J12.82

## 2019-01-06 HISTORY — DX: COVID-19: U07.1

## 2019-02-06 DIAGNOSIS — U071 COVID-19: Secondary | ICD-10-CM | POA: Insufficient documentation

## 2019-04-07 ENCOUNTER — Emergency Department
Admission: EM | Admit: 2019-04-07 | Discharge: 2019-04-07 | Disposition: A | Discharge status: Home / Self Care | Payer: BLUE CROSS/BLUE SHIELD | Source: Home / Self Care

## 2019-04-07 ENCOUNTER — Other Ambulatory Visit: Payer: Self-pay

## 2019-04-07 ENCOUNTER — Encounter: Payer: Self-pay | Admitting: Emergency Medicine

## 2019-04-07 DIAGNOSIS — S161XXA Strain of muscle, fascia and tendon at neck level, initial encounter: Secondary | ICD-10-CM

## 2019-04-07 MED ORDER — CYCLOBENZAPRINE HCL 5 MG PO TABS: 5.0000 mg | ORAL_TABLET | Freq: Every day | ORAL | 0 refills | Status: DC

## 2019-04-07 MED ORDER — MELOXICAM 15 MG PO TABS: 15.0000 mg | ORAL_TABLET | Freq: Every day | ORAL | 1 refills | Status: DC

## 2019-04-07 NOTE — Discharge Instructions (Addendum)
Start meloxicam 15 mg once daily in the morning for inflammation.  Take Flexeril 5 mg 30 minutes prior to bedtime to relax muscles in neck.  Symptoms should completely resolve or significantly improved within the next 5 to 7 days.  If symptoms worsen return for follow-up.

## 2019-04-07 NOTE — ED Provider Notes (Signed)
[facility]    CSN: [id number] Arrival date & time: 04/07/19  1611      History   Chief Complaint Chief Complaint  Patient presents with  . Neck Pain    stiffness - unable to turn to Right, slight movement to left     HPI Don Dominguez is a 54 y.o. male.   HPI  Patient presents today with a complaint of neck pain.  Neck pain started approximately 3 weeks ago and he initially thought pain was related to sleep position or pillow.  He has tried changing pillows and changing sleeping positions without relief.  Over the last 3 to 4 days neck stiffness and pain has worsened.  He is having pain with movement to the left mostly.  Pain is reproducible with palpation.  He has taken 1 or 2 doses of ibuprofen with mild improvement.  He denies any difficulty swallowing, chest pain, shoulder pain or headache.  Past Medical History:  Diagnosis Date  . Asthma   . Environmental allergies   . Pneumonia due to 2019 novel coronavirus 01/2019    There are no problems to display for this patient.   Past Surgical History:  Procedure Laterality Date  . HERNIA REPAIR    . SPLENECTOMY, TOTAL    . TONSILLECTOMY         Home Medications    Prior to Admission medications   Medication Sig Start Date End Date Taking? Authorizing Provider  albuterol (PROVENTIL HFA;VENTOLIN HFA) 108 (90 Base) MCG/ACT inhaler Inhale 2 puffs into the lungs every 4 (four) hours as needed for wheezing or shortness of breath. 01/12/18  Yes Kandra Nicolas, MD  cetirizine (ZYRTEC) 10 MG tablet Take 10 mg by mouth daily.   Yes [provider]  doxycycline (VIBRAMYCIN) 100 MG capsule Take 1 capsule (100 mg total) by mouth 2 (two) times daily. Take with food (Rx void after 01/20/18) 01/12/18   Kandra Nicolas, MD  guaiFENesin-codeine 100-10 MG/5ML syrup Take 33mL by mouth at bedtime as needed for cough.  May repeat dose in 4 to 6 hours. 01/12/18   Kandra Nicolas, MD  ipratropium (ATROVENT) 0.06 %  nasal spray Place 2 sprays into both nostrils 4 (four) times daily. 01/21/16   Noe Gens, PA-C  predniSONE (DELTASONE) 20 MG tablet Take one tab by mouth twice daily for 4 days, then one daily for 3 days. Take with food. 01/12/18   Kandra Nicolas, MD    Family History Family History  Problem Relation Age of Onset  . Cancer Other        lung  . Cancer Other        lung  . Heart attack Other   . Diabetes Mother   . Hypertension Mother   . Heart attack Father     Social History Social History   Tobacco Use  . Smoking status: Never Smoker  . Smokeless tobacco: Never Used  Substance Use Topics  . Alcohol use: No  . Drug use: No     Allergies   Patient has no known allergies.   Review of Systems Review of Systems Pertinent negatives listed in HPI  Physical Exam Triage Vital Signs ED Triage Vitals  Enc Vitals Group     BP 04/07/19 1634 136/67     Pulse Rate 04/07/19 1634 76     Resp 04/07/19 1634 20     Temp 04/07/19 1634 98.5 F (36.9 C)     Temp  Source 04/07/19 1634 Oral     SpO2 04/07/19 1634 99 %     Weight 04/07/19 1636 165 lb 8 oz (75.1 kg)     Height 04/07/19 1636 5\' 11"  (1.803 m)     Head Circumference --      Peak Flow --      Pain Score 04/07/19 1635 4     Pain Loc --      Pain Edu? --      Excl. in GC? --    No data found.  Updated Vital Signs BP 136/67 (BP Location: Right Arm)   Pulse 76   Temp 98.5 F (36.9 C) (Oral)   Resp 20   Ht 5\' 11"  (1.803 m)   Wt 165 lb 8 oz (75.1 kg)   SpO2 99%   BMI 23.08 kg/m   Visual Acuity Right Eye Distance:   Left Eye Distance:   Bilateral Distance:    Right Eye Near:   Left Eye Near:    Bilateral Near:     Physical Exam General appearance: alert, well developed, well nourished, cooperative and in no distress Head: Normocephalic, without obvious abnormality, atraumatic Neck: Supple, decreased range of motion, trapezius muscle (left) tenderness Respiratory: Respirations even and unlabored,  normal respiratory rate Heart: rate and rhythm normal.  Extremities: No gross deformities Skin: Skin color, texture, turgor normal. No rashes seen  Psych: Appropriate mood and affect. Neurologic:  Alert, oriented to person, place, and time, thought content appropriate.   UC Treatments / Results  Labs (all labs ordered are listed, but only abnormal results are displayed) Labs Reviewed - No data to display  EKG   Radiology No results found.  Procedures Procedures (including critical care time)  Medications Ordered in UC Medications - No data to display  Initial Impression / Assessment and Plan / UC Course  I have reviewed the triage vital signs and the nursing notes.  Pertinent labs & imaging results that were available during my care of the patient were reviewed by me and considered in my medical decision making (see chart for details).      Final Clinical Impressions(s) / UC Diagnoses   Final diagnoses:  Acute strain of neck muscle, initial encounter  Start meloxicam 15 mg once daily in the morning for inflammation.  Take as needed.  Also prescribed Flexeril 5 mg take 30 minutes prior to bedtime as needed for neck pain.  Follow-up if symptoms do not improve within 5 to 7 days.   Discharge Instructions     Start meloxicam 15 mg once daily in the morning for inflammation.  Take Flexeril 5 mg 30 minutes prior to bedtime to relax muscles in neck.  Symptoms should completely resolve or significantly improved within the next 5 to 7 days.  If symptoms worsen return for follow-up.    ED Prescriptions    Medication Sig Dispense Auth. Provider   meloxicam (MOBIC) 15 MG tablet Take 1 tablet (15 mg total) by mouth daily. 30 tablet 06/07/19, FNP   cyclobenzaprine (FLEXERIL) 5 MG tablet Take 1 tablet (5 mg total) by mouth at bedtime. 15 tablet , FNP     PDMP not reviewed this encounter.   Bing Neighbors, FNP 04/07/19 1753

## 2019-04-07 NOTE — ED Triage Notes (Signed)
C/o neck pain, woke up w/ increased  stiffness- unable to turn side to side - worse on right side- denies injury Intermittent pain x 3 weeks unable to describe pain

## 2020-08-24 ENCOUNTER — Emergency Department
Admission: RE | Admit: 2020-08-24 | Discharge: 2020-08-24 | Discharge status: Absent without leave | Payer: Self-pay | Source: Ambulatory Visit

## 2020-08-25 ENCOUNTER — Emergency Department
Admission: RE | Admit: 2020-08-25 | Discharge: 2020-08-25 | Disposition: A | Discharge status: Home / Self Care | Payer: Self-pay | Source: Ambulatory Visit

## 2020-08-25 ENCOUNTER — Encounter: Payer: Self-pay | Admitting: Emergency Medicine

## 2020-08-25 ENCOUNTER — Emergency Department (INDEPENDENT_AMBULATORY_CARE_PROVIDER_SITE_OTHER): Payer: 59

## 2020-08-25 ENCOUNTER — Other Ambulatory Visit: Payer: Self-pay

## 2020-08-25 VITALS — BP 148/77 | HR 66 | Temp 98.4°F | Resp 18 | Ht 71.0 in | Wt 170.0 lb

## 2020-08-25 DIAGNOSIS — J40 Bronchitis, not specified as acute or chronic: Secondary | ICD-10-CM

## 2020-08-25 DIAGNOSIS — R059 Cough, unspecified: Secondary | ICD-10-CM | POA: Diagnosis not present

## 2020-08-25 DIAGNOSIS — J45909 Unspecified asthma, uncomplicated: Secondary | ICD-10-CM

## 2020-08-25 DIAGNOSIS — R062 Wheezing: Secondary | ICD-10-CM | POA: Diagnosis not present

## 2020-08-25 MED ORDER — BENZONATATE 200 MG PO CAPS: 200.0000 mg | ORAL_CAPSULE | Freq: Three times a day (TID) | ORAL | 0 refills | Status: AC | PRN

## 2020-08-25 MED ORDER — METHYLPREDNISOLONE 4 MG PO TBPK: ORAL_TABLET | ORAL | 0 refills | Status: DC

## 2020-08-25 NOTE — ED Provider Notes (Signed)
[facility]    CSN: [id number] Arrival date & time: 08/25/20  1407      History   Chief Complaint Chief Complaint  Patient presents with   Cough    HPI Don Dominguez is a 55 y.o. male.   HPI 55 year old male presents with cough for 5 days reports using albuterol nebulizer for the past 2 days which is helped him somewhat. Patient is concerned with possibility of bronchitis.  Past Medical History:  Diagnosis Date   Asthma    Environmental allergies    Pneumonia due to 2019 novel coronavirus 01/2019    Patient Active Problem List   Diagnosis Date Noted   COVID-19 02/2019    Past Surgical History:  Procedure Laterality Date   HERNIA REPAIR     SPLENECTOMY, TOTAL     TONSILLECTOMY         Home Medications    Prior to Admission medications   Medication Sig Start Date End Date Taking? Authorizing Provider  benzonatate (TESSALON) 200 MG capsule Take 1 capsule (200 mg total) by mouth 3 (three) times daily as needed for up to 7 days for cough. 08/25/20 09/01/20 Yes Iya Hamed, Casimiro Needle, FNP  methylPREDNISolone (MEDROL DOSEPAK) 4 MG TBPK tablet Take as directed. 08/25/20  Yes Trevor Iha, FNP  albuterol (PROVENTIL HFA;VENTOLIN HFA) 108 (90 Base) MCG/ACT inhaler Inhale 2 puffs into the lungs every 4 (four) hours as needed for wheezing or shortness of breath. Patient not taking: Reported on 08/25/2020 01/12/18   Lattie Haw, MD  atorvastatin (LIPITOR) 40 MG tablet Take 40 mg by mouth daily. 07/15/20   [provider]  cetirizine (ZYRTEC) 10 MG tablet Take 10 mg by mouth daily.    [provider]  cyclobenzaprine (FLEXERIL) 5 MG tablet Take 1 tablet (5 mg total) by mouth at bedtime. Patient not taking: Reported on 08/25/2020 04/07/19   Bing Neighbors, FNP  doxycycline (VIBRAMYCIN) 100 MG capsule Take 1 capsule (100 mg total) by mouth 2 (two) times daily. Take with food (Rx void after 01/20/18) Patient not taking: Reported on 08/25/2020 01/12/18    Lattie Haw, MD  guaiFENesin-codeine 100-10 MG/5ML syrup Take 56mL by mouth at bedtime as needed for cough.  May repeat dose in 4 to 6 hours. Patient not taking: Reported on 08/25/2020 01/12/18   Lattie Haw, MD  ipratropium (ATROVENT) 0.06 % nasal spray Place 2 sprays into both nostrils 4 (four) times daily. Patient not taking: Reported on 08/25/2020 01/21/16   Lurene Shadow, PA-C  meloxicam (MOBIC) 15 MG tablet Take 1 tablet (15 mg total) by mouth daily. Patient not taking: Reported on 08/25/2020 04/07/19   Bing Neighbors, FNP  metoprolol succinate (TOPROL-XL) 25 MG 24 hr tablet Take 25 mg by mouth daily. 07/15/20   [provider]  predniSONE (DELTASONE) 20 MG tablet Take one tab by mouth twice daily for 4 days, then one daily for 3 days. Take with food. Patient not taking: Reported on 08/25/2020 01/12/18   Lattie Haw, MD    Family History Family History  Problem Relation Age of Onset   Cancer Other        lung   Cancer Other        lung   Heart attack Other    Diabetes Mother    Hypertension Mother    Heart attack Father     Social History Social History   Tobacco Use   Smoking status: Never   Smokeless tobacco: Never  Vaping Use   Vaping Use: Never used  Substance Use Topics   Alcohol use: No   Drug use: No     Allergies   Patient has no known allergies.   Review of Systems Review of Systems  Respiratory:  Positive for cough.   All other systems reviewed and are negative.   Physical Exam Triage Vital Signs ED Triage Vitals  Enc Vitals Group     BP 08/25/20 1422 (!) 148/77     Pulse Rate 08/25/20 1422 66     Resp 08/25/20 1422 18     Temp 08/25/20 1422 98.4 F (36.9 C)     Temp Source 08/25/20 1422 Oral     SpO2 08/25/20 1422 97 %     Weight 08/25/20 1423 170 lb (77.1 kg)     Height 08/25/20 1423 5\' 11"  (1.803 m)     Head Circumference --      Peak Flow --      Pain Score 08/25/20 1423 0     Pain Loc --      Pain Edu? --       Excl. in GC? --    No data found.  Updated Vital Signs BP (!) 148/77 (BP Location: Right Arm)   Pulse 66   Temp 98.4 F (36.9 C) (Oral)   Resp 18   Ht 5\' 11"  (1.803 m)   Wt 170 lb (77.1 kg)   SpO2 97%   BMI 23.71 kg/m   Physical Exam Vitals and nursing note reviewed.  Constitutional:      General: He is not in acute distress.    Appearance: Normal appearance. He is normal weight. He is not ill-appearing.  HENT:     Head: Normocephalic and atraumatic.     Right Ear: Tympanic membrane, ear canal and external ear normal.     Left Ear: Tympanic membrane, ear canal and external ear normal.     Mouth/Throat:     Mouth: Mucous membranes are moist.     Pharynx: Oropharynx is clear.  Eyes:     Extraocular Movements: Extraocular movements intact.     Conjunctiva/sclera: Conjunctivae normal.     Pupils: Pupils are equal, round, and reactive to light.  Cardiovascular:     Rate and Rhythm: Normal rate and regular rhythm.     Pulses: Normal pulses.     Heart sounds: Normal heart sounds.  Pulmonary:     Effort: Pulmonary effort is normal.     Breath sounds: Wheezing present. No rhonchi or rales.     Comments: Subtle wheezes and decreased breath sounds noted throughout Musculoskeletal:        General: Normal range of motion.     Cervical back: Normal range of motion and neck supple. No tenderness.  Lymphadenopathy:     Cervical: No cervical adenopathy.  Skin:    General: Skin is warm and dry.  Neurological:     General: No focal deficit present.     Mental Status: He is alert and oriented to person, place, and time.  Psychiatric:        Mood and Affect: Mood normal.        Behavior: Behavior normal.     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 Cardiac silhouette is unremarkable. No airspace disease, effusion, or pneumothorax. No acute bony abnormalities. IMPRESSION: 1. No acute intrathoracic process. Electronically Signed   By: Sharlet Salina M.D.   On: 08/25/2020 15:51    Procedures Procedures (including critical care time)  Medications Ordered in UC Medications - No data to display  Initial Impression / Assessment and Plan / UC Course  I have reviewed the triage vital signs and the nursing notes.  Pertinent labs & imaging results that were available during my care of the patient were reviewed by me and considered in my medical decision making (see chart for details).     MDM: 1. Cough-Rx'd [employer], 2.  Wheezes-Rx'd Medrol Dosepak.  Advised patient to take medication as directed with food to completion.  Patient discharged home, hemodynamically stable. Final Clinical Impressions(s) / UC Diagnoses   Final diagnoses:  Cough  Wheezes     Discharge Instructions      Advised patient to take medication (Medrol Dosepak) as directed with food to completion.  Advised patient may use Tessalon Perles daily for cough or as needed.  Encourage patient increase daily water intake while taking this medication     ED Prescriptions     Medication Sig Dispense Auth. Provider   benzonatate (TESSALON) 200 MG capsule Take 1 capsule (200 mg total) by mouth 3 (three) times daily as needed for up to 7 days for cough. 30 capsule Trevor Iha, FNP   methylPREDNISolone (MEDROL DOSEPAK) 4 MG TBPK tablet Take as directed. 1 each Trevor Iha, FNP      PDMP not reviewed this encounter.   Trevor Iha, FNP 08/25/20 1636

## 2020-08-25 NOTE — Discharge Instructions (Addendum)
Advised patient to take medication (Medrol Dosepak) as directed with food to completion.  Advised patient may use Tessalon Perles daily for cough or as needed.  Encourage patient increase daily water intake while taking this medication

## 2020-08-25 NOTE — ED Triage Notes (Signed)
Cough x 5 days  Worse in am on waking and at night  Has used his albuterol nebulizer the last 2 days  Has helped some  Pt wants to rule out bronchitis

## 2021-05-10 ENCOUNTER — Emergency Department
Admission: RE | Admit: 2021-05-10 | Discharge: 2021-05-10 | Disposition: A | Discharge status: Home / Self Care | Payer: 59 | Source: Ambulatory Visit | Attending: Family Medicine | Admitting: Family Medicine

## 2021-05-10 VITALS — BP 155/80 | HR 74 | Temp 98.0°F | Resp 14

## 2021-05-10 DIAGNOSIS — M7989 Other specified soft tissue disorders: Secondary | ICD-10-CM | POA: Diagnosis not present

## 2021-05-10 DIAGNOSIS — S8012XA Contusion of left lower leg, initial encounter: Secondary | ICD-10-CM

## 2021-05-10 NOTE — ED Provider Notes (Signed)
?[facility] ? ? ? ?CSN: [id number] ?Arrival date & time: 05/10/21  0948 ? ? ?  ? ?History   ?Chief Complaint ?Chief Complaint  ?Patient presents with  ? Leg Swelling  ?  Possible spider/bite - Entered by patient  ? ? ?HPI ?Don Dominguez is a 56 y.o. male.  ? ?HPI ? ?Patient has swelling and discoloration on his left leg.  Is been present for several days.  He is here to have this evaluated.  There is a small wound in the center and he wonders whether it could be an insect bite. ?Patient is on metformin for type 2 diabetes ?He has a history of an aortic aneurysm and coronary artery disease.  He states his cardiologist told him it was probably because of COVID.  He has well-controlled hyperlipidemia and hypertension. ?Patient takes an aspirin a day as his only blood thinner ? ?Past Medical History:  ?Diagnosis Date  ? Asthma   ? Environmental allergies   ? Pneumonia due to 2019 novel coronavirus 01/2019  ? ? ?Patient Active Problem List  ? Diagnosis Date Noted  ? COVID-19 02/2019  ? ? ?Past Surgical History:  ?Procedure Laterality Date  ? HERNIA REPAIR    ? SPLENECTOMY, TOTAL    ? TONSILLECTOMY    ? ? ? ? ? ?Home Medications   ? ?Prior to Admission medications   ?Medication Sig Start Date End Date Taking? Authorizing Provider  ?atorvastatin (LIPITOR) 40 MG tablet Take 40 mg by mouth daily. 07/15/20   [provider]  ?cetirizine (ZYRTEC) 10 MG tablet Take 10 mg by mouth daily.    [provider]  ?metoprolol succinate (TOPROL-XL) 25 MG 24 hr tablet Take 25 mg by mouth daily. 07/15/20   [provider]  ? ? ?Family History ?Family History  ?Problem Relation Age of Onset  ? Cancer Other   ?     lung  ? Cancer Other   ?     lung  ? Heart attack Other   ? Diabetes Mother   ? Hypertension Mother   ? Heart attack Father   ? ? ?Social History ?Social History  ? ?Tobacco Use  ? Smoking status: Never  ? Smokeless tobacco: Never  ?Vaping Use  ? Vaping Use: Never used  ?Substance Use Topics   ? Alcohol use: No  ? Drug use: No  ? ? ? ?Allergies   ?Patient has no known allergies. ? ? ?Review of Systems ?Review of Systems ?See HPI ? ?Physical Exam ?Triage Vital Signs ?ED Triage Vitals  ?Enc Vitals Group  ?   BP 05/10/21 1010 (!) 155/80  ?   Pulse Rate 05/10/21 1010 74  ?   Resp 05/10/21 1010 14  ?   Temp 05/10/21 1010 98 ?F (36.7 ?C)  ?   Temp Source 05/10/21 1010 Oral  ?   SpO2 05/10/21 1010 98 %  ?   Weight --   ?   Height --   ?   Head Circumference --   ?   Peak Flow --   ?   Pain Score 05/10/21 1011 0  ?   Pain Loc --   ?   Pain Edu? --   ?   Excl. in GC? --   ? ?No data found. ? ?Updated Vital Signs ?BP (!) 155/80 (BP Location: Left Arm)   Pulse 74   Temp 98 ?F (36.7 ?C) (Oral)   Resp 14   SpO2 98%  ? ?   ? ?  Physical Exam ?Constitutional:   ?   General: He is not in acute distress. ?   Appearance: He is well-developed.  ?HENT:  ?   Head: Normocephalic and atraumatic.  ?Eyes:  ?   Conjunctiva/sclera: Conjunctivae normal.  ?   Pupils: Pupils are equal, round, and reactive to light.  ?Cardiovascular:  ?   Rate and Rhythm: Normal rate.  ?Pulmonary:  ?   Effort: Pulmonary effort is normal. No respiratory distress.  ?Abdominal:  ?   General: There is no distension.  ?   Palpations: Abdomen is soft.  ?Musculoskeletal:     ?   General: Normal range of motion.  ?   Cervical back: Normal range of motion.  ?Skin: ?   General: Skin is warm and dry.  ?   Comments: Left leg is examined.  See photo.  There is a small wound in the center of the anterior tibial region that is healing.  There is a palpable hematoma underneath this wound that measures 2 to 3 cm across.  There is resolving ecchymosis inferior to the wound.  No calf tenderness.  No ankle edema.  Good range of motion of knee and ankle.  Area is minimally tender  ?Neurological:  ?   General: No focal deficit present.  ?   Mental Status: He is alert.  ?   Gait: Gait normal.  ? ? ? ? ?UC Treatments / Results  ?Labs ?(all labs ordered are listed, but only  abnormal results are displayed) ?Labs Reviewed - No data to display ? ?EKG ? ? ?Radiology ?No results found. ? ?Procedures ?Procedures (including critical care time) ? ?Medications Ordered in UC ?Medications - No data to display ? ?Initial Impression / Assessment and Plan / UC Course  ?I have reviewed the triage vital signs and the nursing notes. ? ?Pertinent labs & imaging results that were available during my care of the patient were reviewed by me and considered in my medical decision making (see chart for details). ? ?  ? ?I explained to the patient I believe that he has a contusion with some bruising.  This will resolve without consequence ?Final Clinical Impressions(s) / UC Diagnoses  ? ?Final diagnoses:  ?Left leg swelling  ?Contusion of left lower leg, initial encounter  ? ? ? ?Discharge Instructions   ? ?  ?I believe your leg is bruised from injury.  This will go away over time. ?Limit standing walking until your bruises have improved ? ? ? ? ?ED Prescriptions   ?None ?  ? ?PDMP not reviewed this encounter. ?  ?Eustace Moore, MD ?05/10/21 1055 ? ?

## 2021-05-10 NOTE — Discharge Instructions (Signed)
I believe your leg is bruised from injury.  This will go away over time. ?Limit standing walking until your bruises have improved ?

## 2021-05-10 NOTE — ED Triage Notes (Signed)
Pt presents with left lower leg swelling and bruising x3 days. NKI ?

## 2021-09-30 ENCOUNTER — Telehealth: Payer: Self-pay

## 2021-09-30 ENCOUNTER — Ambulatory Visit
Admission: RE | Admit: 2021-09-30 | Discharge: 2021-09-30 | Disposition: A | Discharge status: Home / Self Care | Payer: 59 | Source: Ambulatory Visit | Attending: Family Medicine | Admitting: Family Medicine

## 2021-09-30 ENCOUNTER — Ambulatory Visit (INDEPENDENT_AMBULATORY_CARE_PROVIDER_SITE_OTHER): Payer: 59

## 2021-09-30 ENCOUNTER — Other Ambulatory Visit: Payer: Self-pay

## 2021-09-30 VITALS — BP 118/73 | HR 66 | Temp 98.1°F | Resp 17

## 2021-09-30 DIAGNOSIS — M25551 Pain in right hip: Secondary | ICD-10-CM

## 2021-09-30 MED ORDER — CELECOXIB 100 MG PO CAPS: 100.0000 mg | ORAL_CAPSULE | Freq: Two times a day (BID) | ORAL | 0 refills | Status: AC

## 2021-09-30 NOTE — ED Provider Notes (Signed)
[facility]    CSN: [id number] Arrival date & time: 09/30/21  [id number]      History   Chief Complaint Chief Complaint  Patient presents with   Hip Pain    Right; APPT 830AM    HPI Don Dominguez is a 56 y.o. male.   HPI 56 year old male presents with right hip pain since Thursday or 2 days ago.  Reports using ibuprofen and heat as needed.  PMH significant for asthma and environmental allergies.  Past Medical History:  Diagnosis Date   Asthma    Environmental allergies    Pneumonia due to 2019 novel coronavirus 01/2019    Patient Active Problem List   Diagnosis Date Noted   COVID-19 02/2019    Past Surgical History:  Procedure Laterality Date   HERNIA REPAIR     SPLENECTOMY, TOTAL     TONSILLECTOMY         Home Medications    Prior to Admission medications   Medication Sig Start Date End Date Taking? Authorizing Provider  celecoxib (CELEBREX) 100 MG capsule Take 1 capsule (100 mg total) by mouth 2 (two) times daily for 15 days. 09/30/21 10/15/21 Yes Eliezer Lofts, FNP  atorvastatin (LIPITOR) 40 MG tablet Take 40 mg by mouth daily. 07/15/20   [provider]  cetirizine (ZYRTEC) 10 MG tablet Take 10 mg by mouth daily.    [provider]  metoprolol succinate (TOPROL-XL) 25 MG 24 hr tablet Take 25 mg by mouth daily. 07/15/20   [provider]    Family History Family History  Problem Relation Age of Onset   Cancer Other        lung   Cancer Other        lung   Heart attack Other    Diabetes Mother    Hypertension Mother    Heart attack Father     Social History Social History   Tobacco Use   Smoking status: Never   Smokeless tobacco: Never  Vaping Use   Vaping Use: Never used  Substance Use Topics   Alcohol use: No   Drug use: No     Allergies   Patient has no known allergies.   Review of Systems Review of Systems   Physical Exam Triage Vital Signs ED Triage Vitals  Enc Vitals Group     BP  09/30/21 0845 118/73     Pulse Rate 09/30/21 0845 66     Resp 09/30/21 0845 17     Temp 09/30/21 0845 98.1 F (36.7 C)     Temp Source 09/30/21 0845 Oral     SpO2 09/30/21 0845 99 %     Weight --      Height --      Head Circumference --      Peak Flow --      Pain Score 09/30/21 0847 0     Pain Loc --      Pain Edu? --      Excl. in [place]? --    No data found.  Updated Vital Signs BP 118/73 (BP Location: Right Arm)   Pulse 66   Temp 98.1 F (36.7 C) (Oral)   Resp 17   SpO2 99%   Physical Exam Vitals and nursing note reviewed.  Constitutional:      Appearance: Normal appearance. He is normal weight.  HENT:     Head: Normocephalic and atraumatic.     Mouth/Throat:     Mouth: Mucous membranes are moist.  Pharynx: Oropharynx is clear.  Eyes:     Extraocular Movements: Extraocular movements intact.     Conjunctiva/sclera: Conjunctivae normal.     Pupils: Pupils are equal, round, and reactive to light.  Cardiovascular:     Rate and Rhythm: Normal rate and regular rhythm.     Pulses: Normal pulses.     Heart sounds: Normal heart sounds.  Pulmonary:     Effort: Pulmonary effort is normal.     Breath sounds: Normal breath sounds. No wheezing, rhonchi or rales.  Musculoskeletal:        General: Normal range of motion.     Cervical back: Normal range of motion and neck supple.  Skin:    General: Skin is warm and dry.  Neurological:     General: No focal deficit present.     Mental Status: He is alert and oriented to person, place, and time. Mental status is at baseline.     Gait: Gait normal.      UC Treatments / Results  Labs (all labs ordered are listed, but only abnormal results are displayed) Labs Reviewed - No data to display  EKG   Radiology DG Hip Unilat W or Wo Pelvis 2-3 Views Right  Result Date: 09/30/2021 CLINICAL DATA:  Right anterior hip pain.  No known injury. EXAM: DG HIP (WITH OR WITHOUT PELVIS) 2-3V RIGHT COMPARISON:  None Available.  FINDINGS: There is no evidence of hip fracture or dislocation. Early degenerative spurring of the femoral head is seen without significant joint space narrowing. No focal bone lesions identified. Lower lumbar spine degenerative changes noted. Surgical staple seen in the left inguinal region. IMPRESSION: No acute findings. Early right hip degenerative spurring. Electronically Signed   By: Danae Orleans M.D.   On: 09/30/2021 09:19    Procedures Procedures (including critical care time)  Medications Ordered in UC Medications - No data to display  Initial Impression / Assessment and Plan / UC Course  I have reviewed the triage vital signs and the nursing notes.  Pertinent labs & imaging results that were available during my care of the patient were reviewed by me and considered in my medical decision making (see chart for details).     MDM: 1.  Right hip pain-right hip x-ray results are above.  Rx'd Celebrex. Informed patient of right hip x-ray results with hard copy provided to patient.  Advised patient to take medication as directed with food to completion.  Encourage patient increase daily water intake while taking this medication.  Advised patient if symptoms worsen and/or unresolved please follow-up with PCP or [facility] orthopedic provider for further evaluation.  Contact information for this provider is below.  Patient discharged home, hemodynamically stable. Final Clinical Impressions(s) / UC Diagnoses   Final diagnoses:  Right hip pain     Discharge Instructions      Informed patient of right hip x-ray results with hard copy provided to patient.  Advised patient to take medication as directed with food to completion.  Encourage patient increase daily water intake while taking this medication.  Advised patient if symptoms worsen and/or unresolved please follow-up with PCP or [facility] orthopedic provider for further evaluation.  Contact information for this provider is  below.     ED Prescriptions     Medication Sig Dispense Auth. Provider   celecoxib (CELEBREX) 100 MG capsule Take 1 capsule (100 mg total) by mouth 2 (two) times daily for 15 days. 30 capsule Trevor Iha, FNP  PDMP not reviewed this encounter.   Trevor Iha, FNP 09/30/21 1002

## 2021-09-30 NOTE — Discharge Instructions (Addendum)
Informed patient of right hip x-ray results with hard copy provided to patient.  Advised patient to take medication as directed with food to completion.  Encourage patient increase daily water intake while taking this medication.  Advised patient if symptoms worsen and/or unresolved please follow-up with PCP or [facility] orthopedic provider for further evaluation.  Contact information for this provider is below.

## 2021-09-30 NOTE — Telephone Encounter (Signed)
TC from pt, states he wants a work note stating he can return to work w/o restriction. Discussed w/ [employer], FNP who states this is okay to provide.

## 2021-09-30 NOTE — ED Triage Notes (Signed)
Pt c/o RT hip pain since Thursday. Ibuprofen and heat prn.

## 2021-12-31 ENCOUNTER — Ambulatory Visit: Payer: Self-pay

## 2022-01-01 ENCOUNTER — Ambulatory Visit
Admission: RE | Admit: 2022-01-01 | Discharge: 2022-01-01 | Disposition: A | Discharge status: Home / Self Care | Payer: BC Managed Care – PPO | Source: Ambulatory Visit

## 2022-01-01 VITALS — BP 127/79 | HR 61 | Temp 98.7°F | Resp 18 | Ht 69.0 in | Wt 170.5 lb

## 2022-01-01 DIAGNOSIS — J0100 Acute maxillary sinusitis, unspecified: Secondary | ICD-10-CM

## 2022-01-01 DIAGNOSIS — R059 Cough, unspecified: Secondary | ICD-10-CM

## 2022-01-01 MED ORDER — BENZONATATE 200 MG PO CAPS: 200.0000 mg | ORAL_CAPSULE | Freq: Three times a day (TID) | ORAL | 0 refills | Status: AC | PRN

## 2022-01-01 MED ORDER — AMOXICILLIN-POT CLAVULANATE 875-125 MG PO TABS: 1.0000 | ORAL_TABLET | Freq: Two times a day (BID) | ORAL | 0 refills | Status: DC

## 2022-01-01 NOTE — ED Provider Notes (Signed)
[facility]    CSN: [id number] Arrival date & time: 01/01/22  0855      History   Chief Complaint Chief Complaint  Patient presents with   Cough    Coughing and wheezing - Entered by patient    HPI Don Dominguez is a 56 y.o. male.   HPI 56 year old male presents for productive cough and sinus nasal congestion for 2 weeks.  Patient is concerned with pneumonia.  PMH significant for total splenectomy and asthma.  Past Medical History:  Diagnosis Date   Asthma    Environmental allergies    Pneumonia due to 2019 novel coronavirus 01/2019    Patient Active Problem List   Diagnosis Date Noted   COVID-19 02/2019    Past Surgical History:  Procedure Laterality Date   HERNIA REPAIR     SPLENECTOMY, TOTAL     TONSILLECTOMY         Home Medications    Prior to Admission medications   Medication Sig Start Date End Date Taking? Authorizing Provider  amoxicillin-clavulanate (AUGMENTIN) 875-125 MG tablet Take 1 tablet by mouth every 12 (twelve) hours. 01/01/22  Yes Trevor Iha, FNP  atorvastatin (LIPITOR) 40 MG tablet Take 40 mg by mouth daily. 07/15/20  Yes [provider]  benzonatate (TESSALON) 200 MG capsule Take 1 capsule (200 mg total) by mouth 3 (three) times daily as needed for up to 7 days. 01/01/22 01/08/22 Yes Trevor Iha, FNP  cetirizine (ZYRTEC) 10 MG tablet Take 10 mg by mouth daily.   Yes [provider]  fexofenadine (ALLEGRA) 180 MG tablet Take 180 mg by mouth daily.   Yes [provider]  metoprolol succinate (TOPROL-XL) 25 MG 24 hr tablet Take 25 mg by mouth daily. 07/15/20  Yes [provider]    Family History Family History  Problem Relation Age of Onset   Cancer Other        lung   Cancer Other        lung   Heart attack Other    Diabetes Mother    Hypertension Mother    Heart attack Father     Social History Social History   Tobacco Use   Smoking status: Never   Smokeless tobacco:  Never  Vaping Use   Vaping Use: Never used  Substance Use Topics   Alcohol use: No   Drug use: No     Allergies   Patient has no known allergies.   Review of Systems Review of Systems  HENT:  Positive for congestion.   Respiratory:  Positive for cough.   All other systems reviewed and are negative.    Physical Exam Triage Vital Signs ED Triage Vitals  Enc Vitals Group     BP      Pulse      Resp      Temp      Temp src      SpO2      Weight      Height      Head Circumference      Peak Flow      Pain Score      Pain Loc      Pain Edu?      Excl. in GC?    No data found.  Updated Vital Signs BP 127/79 (BP Location: Right Arm)   Pulse 61   Temp 98.7 F (37.1 C) (Oral)   Resp 18   Ht 5\' 9"  (1.753 m)   Wt  170 lb 8 oz (77.3 kg)   SpO2 98%   BMI 25.18 kg/m   Physical Exam Vitals and nursing note reviewed.  Constitutional:      Appearance: Normal appearance. He is normal weight. He is ill-appearing.  HENT:     Head: Normocephalic and atraumatic.     Right Ear: Tympanic membrane and external ear normal.     Left Ear: Tympanic membrane and external ear normal.     Ears:     Comments: Significant eustachian tube dysfunction noted bilaterally    Mouth/Throat:     Mouth: Mucous membranes are moist.     Pharynx: Oropharynx is clear.  Eyes:     Extraocular Movements: Extraocular movements intact.     Conjunctiva/sclera: Conjunctivae normal.     Pupils: Pupils are equal, round, and reactive to light.  Cardiovascular:     Rate and Rhythm: Normal rate and regular rhythm.     Pulses: Normal pulses.     Heart sounds: Normal heart sounds.  Pulmonary:     Effort: Pulmonary effort is normal.     Breath sounds: Normal breath sounds. No wheezing, rhonchi or rales.     Comments: Infrequent nonproductive cough noted on exam Musculoskeletal:        General: Normal range of motion.     Cervical back: Normal range of motion and neck supple.  Skin:    General:  Skin is warm and dry.  Neurological:     General: No focal deficit present.     Mental Status: He is alert and oriented to person, place, and time.      UC Treatments / Results  Labs (all labs ordered are listed, but only abnormal results are displayed) Labs Reviewed - No data to display  EKG   Radiology No results found.  Procedures Procedures (including critical care time)  Medications Ordered in UC Medications - No data to display  Initial Impression / Assessment and Plan / UC Course  I have reviewed the triage vital signs and the nursing notes.  Pertinent labs & imaging results that were available during my care of the patient were reviewed by me and considered in my medical decision making (see chart for details).     MDM: 1.  Subacute maxillary sinusitis-Augmentin; 2.  Cough-Rx'd Tessalon. Advised patient to take medications as directed with food to completion.  Advised patient may take Tessalon perles daily or as needed for cough.  Encouraged patient increase daily water intake to 64 ounces per day while taking these medications.  Advised patient if symptoms worsen and/or unresolved please follow-up with PCP or here for further evaluation.  Patient discharged home, hemodynamically stable.  Final Clinical Impressions(s) / UC Diagnoses   Final diagnoses:  Cough, unspecified type  Subacute maxillary sinusitis     Discharge Instructions      Advised patient to take medications as directed with food to completion.  Advised patient may take Tessalon perles daily or as needed for cough.  Encouraged patient increase daily water intake to 64 ounces per day while taking these medications.  Advised patient if symptoms worsen and/or unresolved please follow-up with PCP or here for further evaluation.     ED Prescriptions     Medication Sig Dispense Auth. Provider   amoxicillin-clavulanate (AUGMENTIN) 875-125 MG tablet Take 1 tablet by mouth every 12 (twelve) hours. 14  tablet Trevor Iha, FNP   benzonatate (TESSALON) 200 MG capsule Take 1 capsule (200 mg total) by mouth 3 (three) times  daily as needed for up to 7 days. 40 capsule Trevor Iha, FNP      PDMP not reviewed this encounter.   Trevor Iha, FNP 01/01/22 [phone]

## 2022-01-01 NOTE — ED Triage Notes (Signed)
Patient c/o some productive cough and congestion x 2 weeks.  Worse at night, having to use inhaler.  Concern for pneumonia.  Denies any OTC cold meds.  Patient has been taken Herbal teas and allergy meds.

## 2022-01-01 NOTE — Discharge Instructions (Addendum)
Advised patient to take medications as directed with food to completion.  Advised patient may take Tessalon perles daily or as needed for cough.  Encouraged patient increase daily water intake to 64 ounces per day while taking these medications.  Advised patient if symptoms worsen and/or unresolved please follow-up with PCP or here for further evaluation.

## 2022-01-13 ENCOUNTER — Ambulatory Visit
Admission: RE | Admit: 2022-01-13 | Discharge: 2022-01-13 | Disposition: A | Discharge status: Home / Self Care | Payer: BC Managed Care – PPO | Source: Ambulatory Visit | Attending: Family Medicine | Admitting: Family Medicine

## 2022-01-13 VITALS — BP 119/73 | HR 64 | Temp 98.3°F | Resp 18 | Ht 70.0 in | Wt 167.0 lb

## 2022-01-13 DIAGNOSIS — J209 Acute bronchitis, unspecified: Secondary | ICD-10-CM

## 2022-01-13 MED ORDER — AZITHROMYCIN 250 MG PO TABS: ORAL_TABLET | ORAL | 0 refills | Status: DC

## 2022-01-13 NOTE — Discharge Instructions (Addendum)
Drink lots of fluids Continue your a cough medicine with dextromethorphan (DM, like Robitussin DM) May use Tessalon as needed for cough Take the Z-Pak as directed See your doctor if not improving by next week

## 2022-01-13 NOTE — ED Provider Notes (Signed)
[name]    CSN: [id number] Arrival date & time: 01/13/22  1359      History   Chief Complaint Chief Complaint  Patient presents with   Cough    Coughing, tired, achy. - Entered by patient    HPI Don Dominguez is a 56 y.o. male.   HPI  Patient was here and of November for upper respiratory symptoms.  Was treated with a course of Augmentin.  States he never completely got better although is improved.  Now he is starting to have cough and congestion again.  Coughing up sputum.  Feeling very tired.  Feels like he is worse than previously.  No sweats chills or fever.  His wife is coughing as well.  She did a home COVID test that was negative  Past Medical History:  Diagnosis Date   Asthma    Environmental allergies    Pneumonia due to 2019 novel coronavirus 01/2019    Patient Active Problem List   Diagnosis Date Noted   COVID-19 02/2019    Past Surgical History:  Procedure Laterality Date   HERNIA REPAIR     SPLENECTOMY, TOTAL     TONSILLECTOMY         Home Medications    Prior to Admission medications   Medication Sig Start Date End Date Taking? Authorizing Provider  atorvastatin (LIPITOR) 40 MG tablet Take 40 mg by mouth daily. 07/15/20  Yes [provider]  azithromycin (ZITHROMAX Z-PAK) 250 MG tablet Take two pills today followed by one a day until gone 01/13/22  Yes Eustace Moore, MD  cetirizine (ZYRTEC) 10 MG tablet Take 10 mg by mouth daily.   Yes [provider]  fexofenadine (ALLEGRA) 180 MG tablet Take 180 mg by mouth daily.   Yes [provider]  metoprolol succinate (TOPROL-XL) 25 MG 24 hr tablet Take 25 mg by mouth daily. 07/15/20  Yes [provider]    Family History Family History  Problem Relation Age of Onset   Cancer Other        lung   Cancer Other        lung   Heart attack Other    Diabetes Mother    Hypertension Mother    Heart attack Father     Social History Social  History   Tobacco Use   Smoking status: Never   Smokeless tobacco: Never  Vaping Use   Vaping Use: Never used  Substance Use Topics   Alcohol use: No   Drug use: No     Allergies   Patient has no known allergies.   Review of Systems Review of Systems See HPI  Physical Exam Triage Vital Signs ED Triage Vitals  Enc Vitals Group     BP 01/13/22 1430 119/73     Pulse Rate 01/13/22 1430 64     Resp 01/13/22 1430 18     Temp 01/13/22 1430 98.3 F (36.8 C)     Temp Source 01/13/22 1430 Oral     SpO2 01/13/22 1430 100 %     Weight 01/13/22 1432 167 lb (75.8 kg)     Height 01/13/22 1432 5\' 10"  (1.778 m)     Head Circumference --      Peak Flow --      Pain Score 01/13/22 1432 0     Pain Loc --      Pain Edu? --      Excl. in GC? --    No data  found.  Updated Vital Signs BP 119/73 (BP Location: Right Arm)   Pulse 64   Temp 98.3 F (36.8 C) (Oral)   Resp 18   Ht 5\' 10"  (1.778 m)   Wt 75.8 kg   SpO2 100%   BMI 23.96 kg/m      Physical Exam Constitutional:      General: He is not in acute distress.    Appearance: He is well-developed.  HENT:     Head: Normocephalic and atraumatic.     Right Ear: Tympanic membrane and ear canal normal.     Left Ear: Tympanic membrane and ear canal normal.     Mouth/Throat:     Pharynx: Posterior oropharyngeal erythema present.  Eyes:     Conjunctiva/sclera: Conjunctivae normal.     Pupils: Pupils are equal, round, and reactive to light.  Cardiovascular:     Rate and Rhythm: Normal rate.  Pulmonary:     Effort: Pulmonary effort is normal. No respiratory distress.     Breath sounds: Rhonchi present.  Abdominal:     General: There is no distension.     Palpations: Abdomen is soft.  Musculoskeletal:        General: Normal range of motion.     Cervical back: Normal range of motion.  Lymphadenopathy:     Cervical: No cervical adenopathy.  Skin:    General: Skin is warm and dry.  Neurological:     Mental Status: He is  alert.      UC Treatments / Results  Labs (all labs ordered are listed, but only abnormal results are displayed) Labs Reviewed - No data to display  EKG   Radiology No results found.  Procedures Procedures (including critical care time)  Medications Ordered in UC Medications - No data to display  Initial Impression / Assessment and Plan / UC Course  I have reviewed the triage vital signs and the nursing notes.  Pertinent labs & imaging results that were available during my care of the patient were reviewed by me and considered in my medical decision making (see chart for details).     Concern for acute symptoms on tail of prior infection versus virus. Final Clinical Impressions(s) / UC Diagnoses   Final diagnoses:  Acute bronchitis, unspecified organism     Discharge Instructions      Drink lots of fluids Continue your a cough medicine with dextromethorphan (DM, like Robitussin DM) May use Tessalon as needed for cough Take the Z-Pak as directed See your doctor if not improving by next week     ED Prescriptions     Medication Sig Dispense Auth. Provider   azithromycin (ZITHROMAX Z-PAK) 250 MG tablet Take two pills today followed by one a day until gone 6 tablet Delton See, MD      PDMP not reviewed this encounter.   Letta Pate, MD 01/13/22 [phone]

## 2022-01-13 NOTE — ED Triage Notes (Signed)
Patient c/o non-productive cough, body aches and feeling tired.  Patient recently treated with antibiotics and completed course.  Patient has taken Ibuprofen and Robitussin.

## 2022-02-10 ENCOUNTER — Ambulatory Visit
Admission: RE | Admit: 2022-02-10 | Discharge: 2022-02-10 | Disposition: A | Discharge status: Home / Self Care | Payer: BC Managed Care – PPO | Source: Ambulatory Visit | Attending: Family Medicine | Admitting: Family Medicine

## 2022-02-10 ENCOUNTER — Telehealth: Payer: Self-pay | Admitting: Emergency Medicine

## 2022-02-10 VITALS — BP 127/83 | HR 63 | Temp 98.2°F | Resp 18 | Ht 70.0 in | Wt 170.0 lb

## 2022-02-10 DIAGNOSIS — J209 Acute bronchitis, unspecified: Secondary | ICD-10-CM

## 2022-02-10 MED ORDER — PREDNISONE 20 MG PO TABS: 40.0000 mg | ORAL_TABLET | Freq: Every day | ORAL | 0 refills | Status: DC

## 2022-02-10 MED ORDER — AZITHROMYCIN 250 MG PO TABS: ORAL_TABLET | ORAL | 0 refills | Status: DC

## 2022-02-10 MED ORDER — DOXYCYCLINE HYCLATE 100 MG PO CAPS: 100.0000 mg | ORAL_CAPSULE | Freq: Two times a day (BID) | ORAL | 0 refills | Status: DC

## 2022-02-10 NOTE — Discharge Instructions (Signed)
Continue to drink lots of water May use over-the-counter cough and cold medicine Take prednisone once a day for 5 days Take the Z-Pak as directed, 2 pills today then once a day until gone See your doctor if not improving by next week

## 2022-02-10 NOTE — Telephone Encounter (Signed)
Per Dr Meda Coffee, patient only needs to pick up and take the Doxycycline not the Z-pack.  Pharmacy notified.

## 2022-02-10 NOTE — ED Triage Notes (Signed)
Patient c/o non-productive cough or "coughing spells."  Body aches, fatigue and headache which has resolved.  Patient having to use his nebulizer and inhaler.

## 2022-02-10 NOTE — ED Provider Notes (Signed)
[facility]    CSN: [id number] Arrival date & time: 02/10/22  0854      History   Chief Complaint Chief Complaint  Patient presents with   Cough    Coughing,wheezing fatigue - Entered by patient    HPI Don Dominguez is a 56 y.o. male.   HPI  Patient is here for another upper respiratory infection.  He states he had an upper respiratory infection off-and-on for 3 months.  He has underlying asthma.  He states that since he gets better from when he seems to catch another 1.  He has a history of pneumonia in the past.  Is concerned when he has a persistent cough and chest congestion.  Currently has been sick for just over a week.  Oriented sputum, cough, shortness of breath, chest pain, fatigue  Past Medical History:  Diagnosis Date   Asthma    Environmental allergies    Pneumonia due to 2019 novel coronavirus 01/2019    Patient Active Problem List   Diagnosis Date Noted   COVID-19 02/2019    Past Surgical History:  Procedure Laterality Date   HERNIA REPAIR     SPLENECTOMY, TOTAL     TONSILLECTOMY         Home Medications    Prior to Admission medications   Medication Sig Start Date End Date Taking? Authorizing Provider  atorvastatin (LIPITOR) 40 MG tablet Take 40 mg by mouth daily. 07/15/20  Yes [provider]  cetirizine (ZYRTEC) 10 MG tablet Take 10 mg by mouth daily.   Yes [provider]  doxycycline (VIBRAMYCIN) 100 MG capsule Take 1 capsule (100 mg total) by mouth 2 (two) times daily. 02/10/22  Yes Raylene Everts, MD  fexofenadine (ALLEGRA) 180 MG tablet Take 180 mg by mouth daily.   Yes [provider]  metoprolol succinate (TOPROL-XL) 25 MG 24 hr tablet Take 25 mg by mouth daily. 07/15/20  Yes [provider]  predniSONE (DELTASONE) 20 MG tablet Take 2 tablets (40 mg total) by mouth daily with breakfast. 02/10/22  Yes Raylene Everts, MD    Family History Family History  Problem Relation Age of Onset    Cancer Other        lung   Cancer Other        lung   Heart attack Other    Diabetes Mother    Hypertension Mother    Heart attack Father     Social History Social History   Tobacco Use   Smoking status: Never   Smokeless tobacco: Never  Vaping Use   Vaping Use: Never used  Substance Use Topics   Alcohol use: No   Drug use: No     Allergies   Patient has no known allergies.   Review of Systems Review of Systems See HPI  Physical Exam Triage Vital Signs ED Triage Vitals  Enc Vitals Group     BP 02/10/22 0905 127/83     Pulse Rate 02/10/22 0905 63     Resp 02/10/22 0905 18     Temp 02/10/22 0905 98.2 F (36.8 C)     Temp Source 02/10/22 0905 Oral     SpO2 02/10/22 0905 97 %     Weight 02/10/22 0907 170 lb (77.1 kg)     Height 02/10/22 0907 5\' 10"  (1.778 m)     Head Circumference --      Peak Flow --      Pain Score 02/10/22 0907 0  Pain Loc --      Pain Edu? --      Excl. in GC? --    No data found.  Updated Vital Signs BP 127/83 (BP Location: Right Arm)   Pulse 63   Temp 98.2 F (36.8 C) (Oral)   Resp 18   Ht 5\' 10"  (1.778 m)   Wt 77.1 kg   SpO2 97%   BMI 24.39 kg/m       Physical Exam Constitutional:      General: He is not in acute distress.    Appearance: Normal appearance. He is well-developed.  HENT:     Head: Normocephalic and atraumatic.     Right Ear: Tympanic membrane and ear canal normal.     Left Ear: Tympanic membrane and ear canal normal.     Nose: Nose normal. No rhinorrhea.     Mouth/Throat:     Pharynx: No posterior oropharyngeal erythema.     Comments: Tonsils surgically absent Eyes:     Conjunctiva/sclera: Conjunctivae normal.     Pupils: Pupils are equal, round, and reactive to light.  Cardiovascular:     Rate and Rhythm: Normal rate and regular rhythm.     Heart sounds: Normal heart sounds.  Pulmonary:     Effort: Pulmonary effort is normal. No respiratory distress.     Breath sounds: Wheezing and rhonchi  present.  Abdominal:     General: There is no distension.     Palpations: Abdomen is soft.  Musculoskeletal:        General: Normal range of motion.     Cervical back: Normal range of motion and neck supple.  Lymphadenopathy:     Cervical: No cervical adenopathy.  Skin:    General: Skin is warm and dry.  Neurological:     Mental Status: He is alert.      UC Treatments / Results  Labs (all labs ordered are listed, but only abnormal results are displayed) Labs Reviewed - No data to display  EKG   Radiology No results found.  Procedures Procedures (including critical care time)  Medications Ordered in UC Medications - No data to display  Initial Impression / Assessment and Plan / UC Course  I have reviewed the triage vital signs and the nursing notes.  Pertinent labs & imaging results that were available during my care of the patient were reviewed by me and considered in my medical decision making (see chart for details).     Final Clinical Impressions(s) / UC Diagnoses   Final diagnoses:  Acute bronchitis, unspecified organism     Discharge Instructions      Continue to drink lots of water May use over-the-counter cough and cold medicine Take prednisone once a day for 5 days Take the Z-Pak as directed, 2 pills today then once a day until gone See your doctor if not improving by next week    ED Prescriptions     Medication Sig Dispense Auth. Provider   predniSONE (DELTASONE) 20 MG tablet Take 2 tablets (40 mg total) by mouth daily with breakfast. 10 tablet , MD   doxycycline (VIBRAMYCIN) 100 MG capsule Take 1 capsule (100 mg total) by mouth 2 (two) times daily. 20 capsule Eustace Moore, MD      PDMP not reviewed this encounter.   Eustace Moore, MD 02/10/22 1059

## 2022-03-15 LAB — EXTERNAL GENERIC LAB PROCEDURE

## 2022-03-15 LAB — COLOGUARD

## 2022-04-14 LAB — EXTERNAL GENERIC LAB PROCEDURE: COLOGUARD: NEGATIVE

## 2022-04-14 LAB — COLOGUARD: COLOGUARD: NEGATIVE

## 2022-06-20 ENCOUNTER — Ambulatory Visit
Admission: RE | Admit: 2022-06-20 | Discharge: 2022-06-20 | Disposition: A | Discharge status: Home / Self Care | Payer: BC Managed Care – PPO | Source: Ambulatory Visit

## 2022-06-20 VITALS — BP 128/79 | HR 71 | Temp 98.0°F | Resp 17

## 2022-06-20 DIAGNOSIS — J069 Acute upper respiratory infection, unspecified: Secondary | ICD-10-CM | POA: Diagnosis not present

## 2022-06-20 NOTE — ED Provider Notes (Signed)
[name]    CSN: [id number] Arrival date & time: 06/20/22  0936      History   Chief Complaint Chief Complaint  Patient presents with   Cough   Nasal Congestion   Eye Drainage    HPI Don Dominguez is a 57 y.o. male.   Pt complains of a cough and congestion. Pt reports symptoms may have started when he bought flowers for his wife for Mothers day.Pt denies fever. No shortness of breath.   The history is provided by the patient. No language interpreter was used.  Cough Cough characteristics:  Non-productive Sputum characteristics:  Nondescript   Past Medical History:  Diagnosis Date   Asthma    Environmental allergies    Pneumonia due to 2019 novel coronavirus 01/2019    Patient Active Problem List   Diagnosis Date Noted   COVID-19 02/2019    Past Surgical History:  Procedure Laterality Date   HERNIA REPAIR     SPLENECTOMY, TOTAL     TONSILLECTOMY         Home Medications    Prior to Admission medications   Medication Sig Start Date End Date Taking? Authorizing Provider  ALPRAZolam Prudy Feeler) 0.5 MG tablet Take 0.5 mg by mouth at bedtime as needed. 08/13/19  Yes [provider]  atorvastatin (LIPITOR) 40 MG tablet Take 40 mg by mouth daily. 07/15/20   [provider]  cetirizine (ZYRTEC) 10 MG tablet Take 10 mg by mouth daily.    [provider]  doxycycline (VIBRAMYCIN) 100 MG capsule Take 1 capsule (100 mg total) by mouth 2 (two) times daily. 02/10/22   Eustace Moore, MD  fexofenadine (ALLEGRA) 180 MG tablet Take 180 mg by mouth daily.    [provider]  metoprolol succinate (TOPROL-XL) 25 MG 24 hr tablet Take 25 mg by mouth daily. 07/15/20   [provider]  predniSONE (DELTASONE) 20 MG tablet Take 2 tablets (40 mg total) by mouth daily with breakfast. 02/10/22   Eustace Moore, MD    Family History Family History  Problem Relation Age of Onset   Cancer Other        lung   Cancer Other         lung   Heart attack Other    Diabetes Mother    Hypertension Mother    Heart attack Father     Social History Social History   Tobacco Use   Smoking status: Never   Smokeless tobacco: Never  Vaping Use   Vaping Use: Never used  Substance Use Topics   Alcohol use: No   Drug use: No     Allergies   Patient has no known allergies.   Review of Systems Review of Systems  Respiratory:  Positive for cough.   All other systems reviewed and are negative.    Physical Exam Triage Vital Signs ED Triage Vitals  Enc Vitals Group     BP 06/20/22 0945 128/79     Pulse Rate 06/20/22 0945 71     Resp 06/20/22 0945 17     Temp 06/20/22 0945 98 F (36.7 C)     Temp Source 06/20/22 0945 Oral     SpO2 06/20/22 0945 97 %     Weight --      Height --      Head Circumference --      Peak Flow --      Pain Score 06/20/22 0946 0     Pain  Loc --      Pain Edu? --      Excl. in GC? --    No data found.  Updated Vital Signs BP 128/79 (BP Location: Right Arm)   Pulse 71   Temp 98 F (36.7 C) (Oral)   Resp 17   SpO2 97%   Visual Acuity Right Eye Distance:   Left Eye Distance:   Bilateral Distance:    Right Eye Near:   Left Eye Near:    Bilateral Near:     Physical Exam Vitals and nursing note reviewed.  Constitutional:      Appearance: He is well-developed.  HENT:     Head: Normocephalic.  Pulmonary:     Effort: Pulmonary effort is normal.  Abdominal:     General: There is no distension.  Musculoskeletal:        General: Normal range of motion.     Cervical back: Normal range of motion.  Neurological:     Mental Status: He is alert and oriented to person, place, and time.      UC Treatments / Results  Labs (all labs ordered are listed, but only abnormal results are displayed) Labs Reviewed - No data to display  EKG   Radiology No results found.  Procedures Procedures (including critical care time)  Medications Ordered in UC Medications -  No data to display  Initial Impression / Assessment and Plan / UC Course  I have reviewed the triage vital signs and the nursing notes.  Pertinent labs & imaging results that were available during my care of the patient were reviewed by me and considered in my medical decision making (see chart for details).     Pt counseled on symptomatic care Final Clinical Impressions(s) / UC Diagnoses   Final diagnoses:  Viral upper respiratory tract infection     Discharge Instructions      Return if any problems.    ED Prescriptions   None    PDMP not reviewed this encounter. An After Visit Summary was printed and given to the patient.       Elson Areas, [place] 06/20/22 1610

## 2022-06-20 NOTE — ED Triage Notes (Signed)
Pt c/o productive cough, congestion and watery eyes since Saturday night. Cough worse at night. Denies fever. Hx of seasonal allergies. Takes allegra daily. Also using robitussin prn.

## 2022-06-20 NOTE — Discharge Instructions (Signed)
Return if any problems.

## 2022-11-13 IMAGING — DX DG CHEST 2V
2 series · 2 of 2 positions shown · non-contrast
Comparison: 02/15/2012 the

CLINICAL DATA: Cough, asthma, bronchitis

EXAM:
CHEST - 2 VIEW

[chest pa]
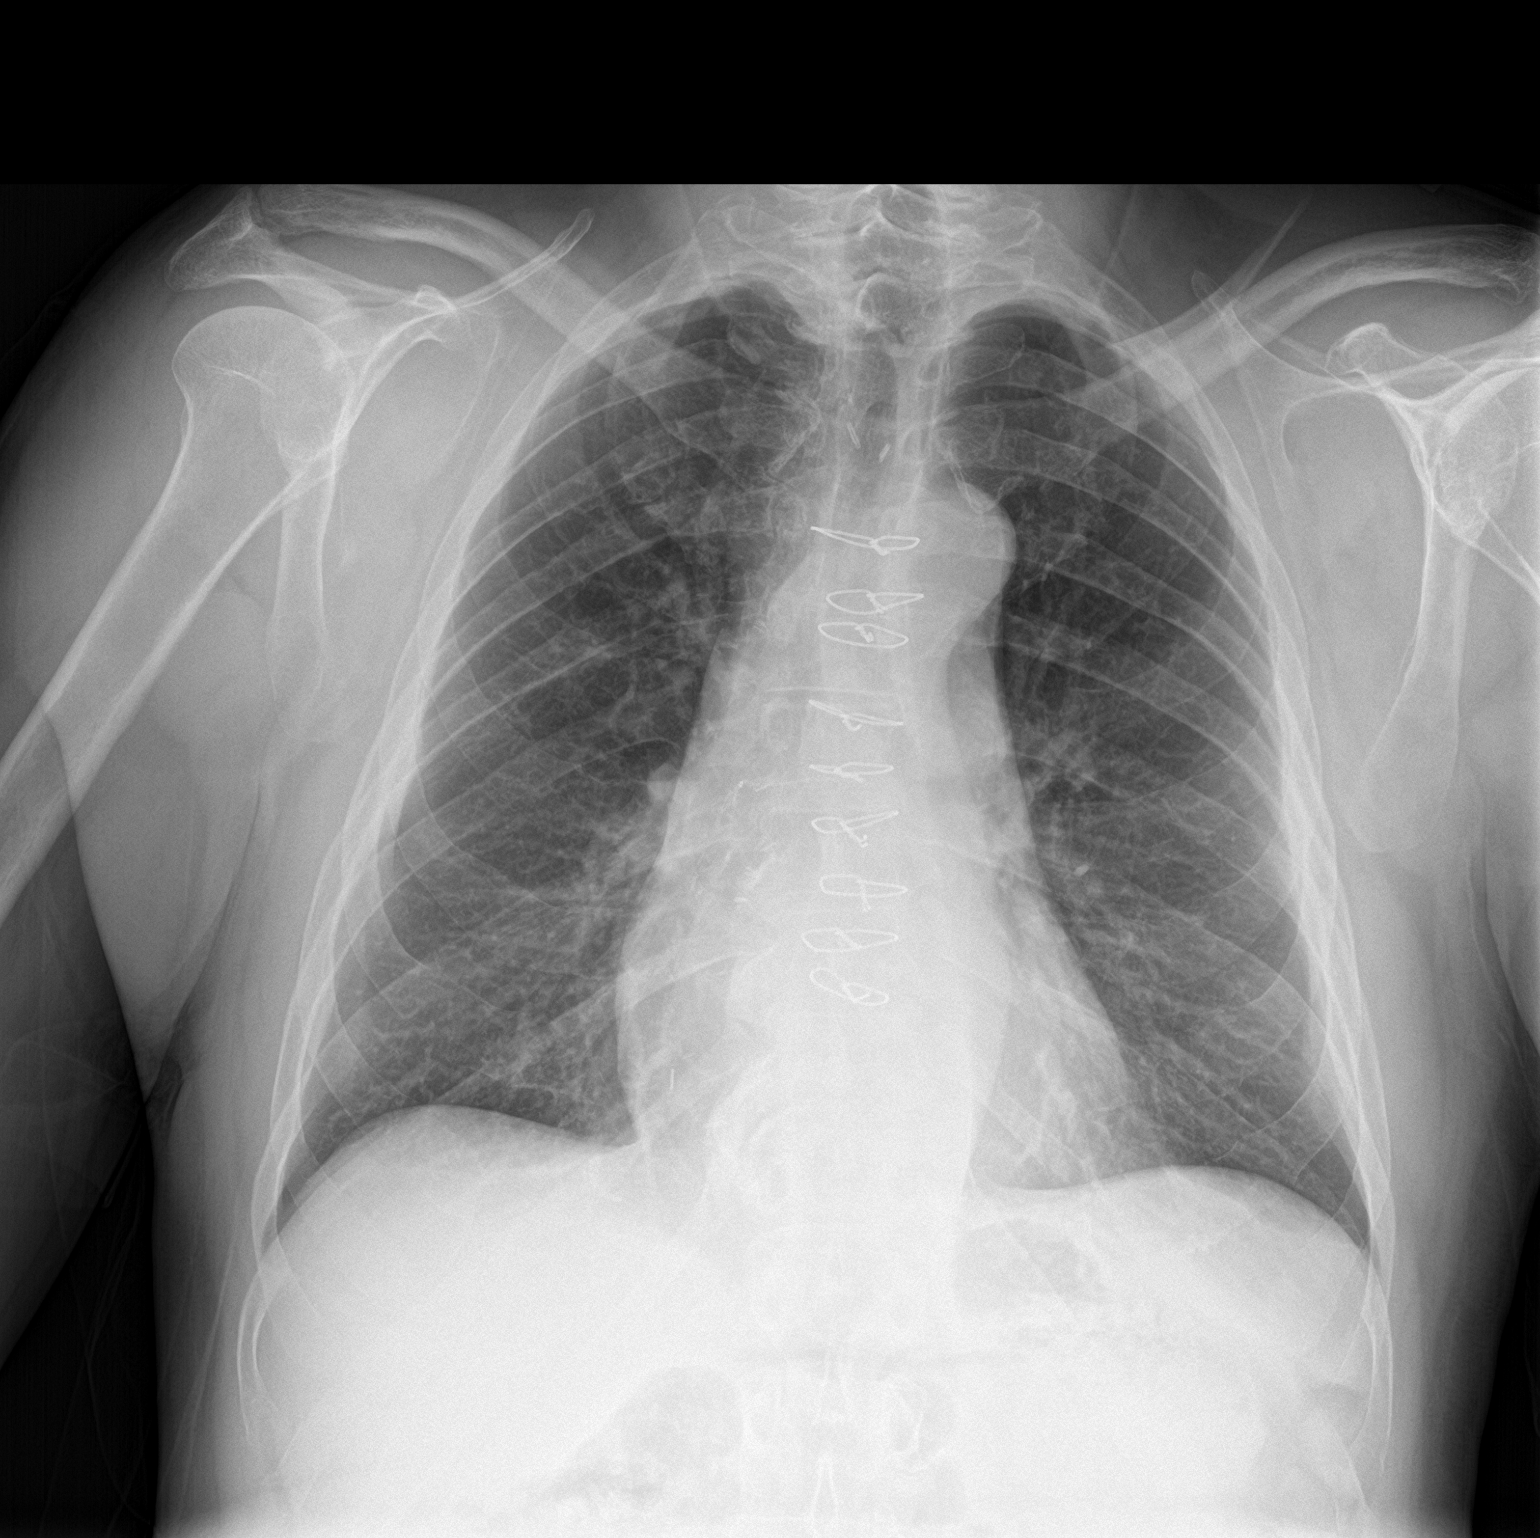

[chest lat]
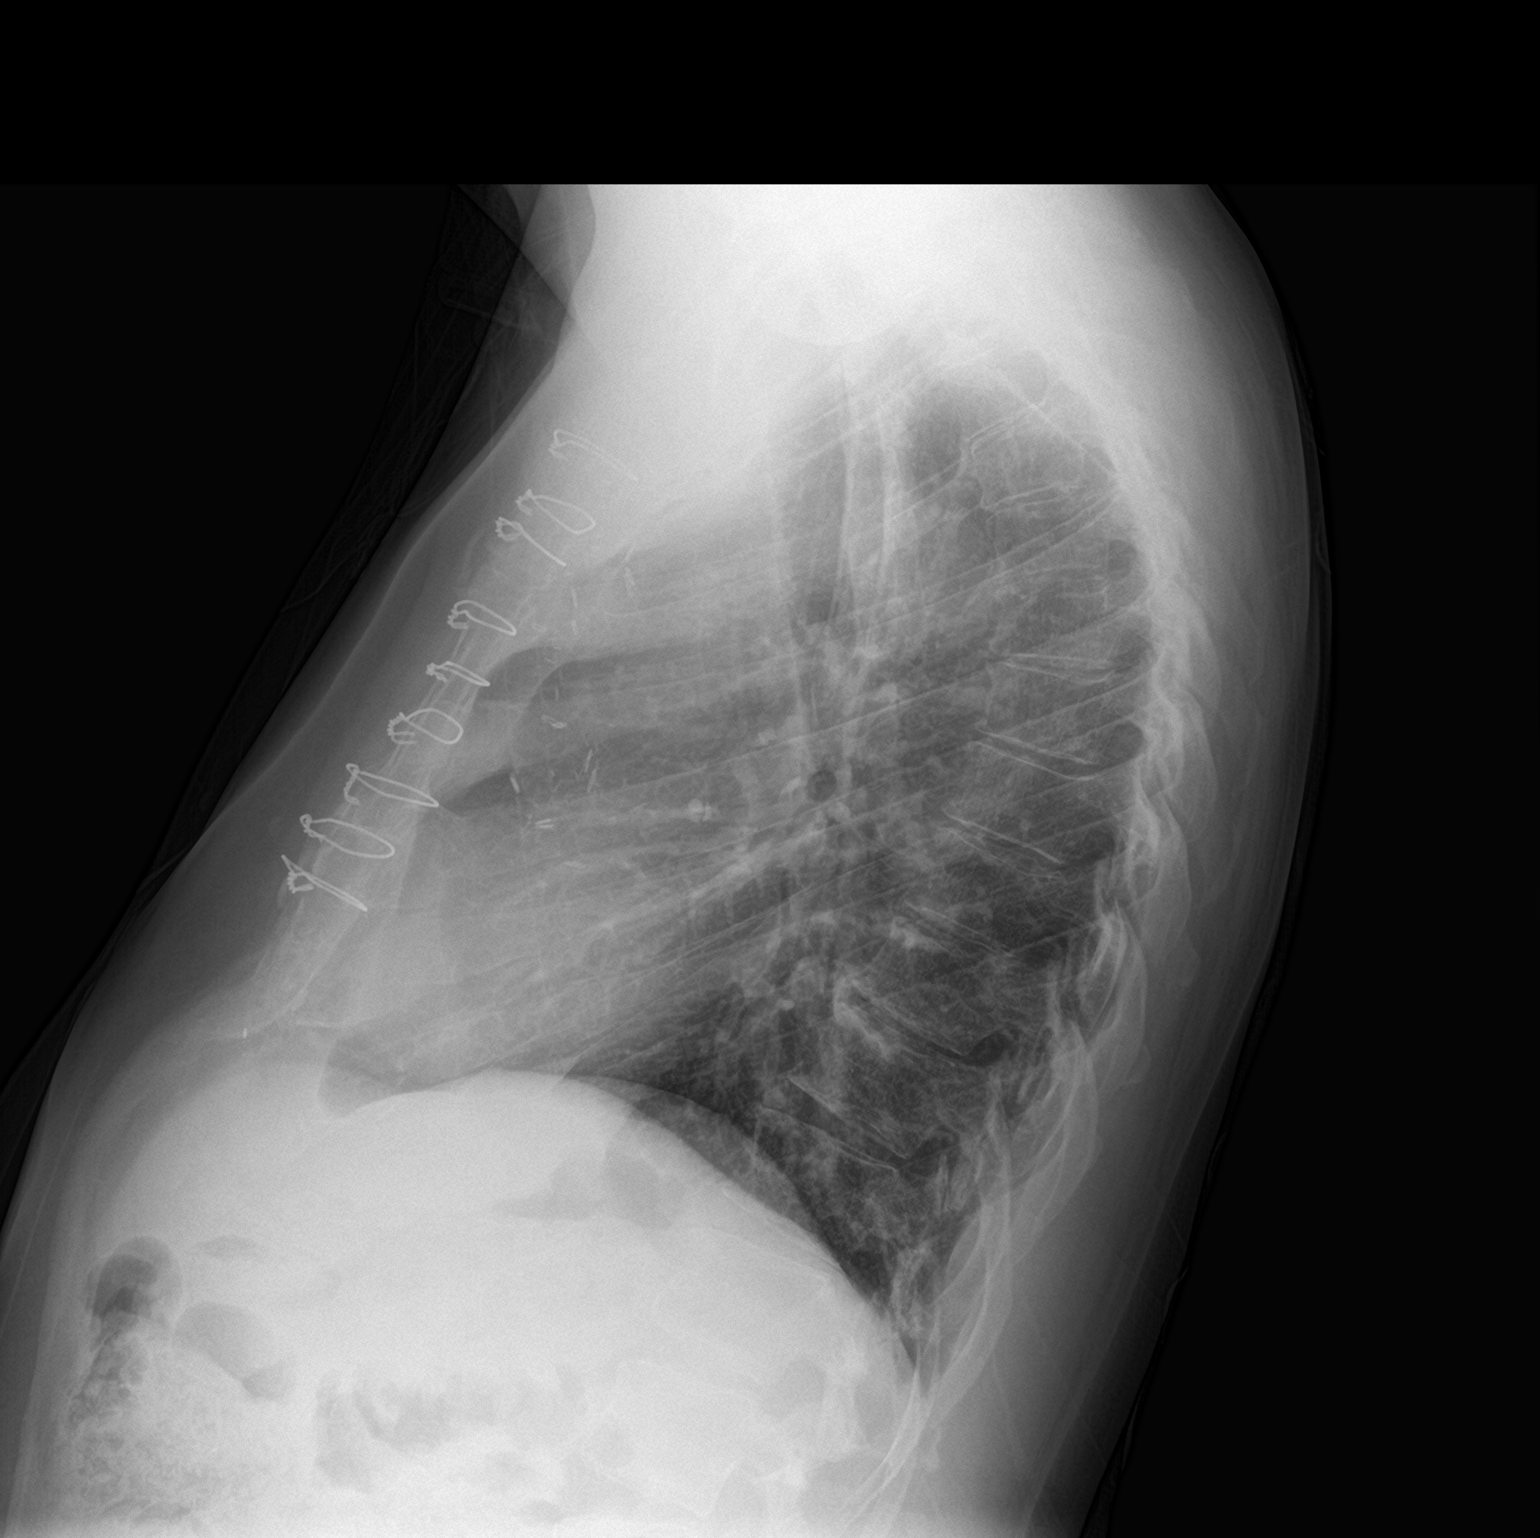

[2 of 2 positions shown; findings below may reference images not displayed]

FINDINGS: Frontal and lateral views of the chest demonstrate postsurgical
changes from previous CABG. Cardiac silhouette is unremarkable. No
airspace disease, effusion, or pneumothorax. No acute bony
abnormalities.
IMPRESSION: 1. No acute intrathoracic process.

## 2023-04-13 ENCOUNTER — Other Ambulatory Visit: Payer: Self-pay

## 2023-04-13 ENCOUNTER — Ambulatory Visit
Admission: RE | Admit: 2023-04-13 | Discharge: 2023-04-13 | Disposition: A | Discharge status: Home / Self Care | Payer: Self-pay | Source: Ambulatory Visit | Attending: Family Medicine | Admitting: Family Medicine

## 2023-04-13 VITALS — BP 145/77 | HR 59 | Temp 98.0°F | Resp 17

## 2023-04-13 DIAGNOSIS — R0981 Nasal congestion: Secondary | ICD-10-CM

## 2023-04-13 DIAGNOSIS — J0100 Acute maxillary sinusitis, unspecified: Secondary | ICD-10-CM

## 2023-04-13 DIAGNOSIS — R059 Cough, unspecified: Secondary | ICD-10-CM | POA: Diagnosis not present

## 2023-04-13 MED ORDER — BENZONATATE 200 MG PO CAPS: 200.0000 mg | ORAL_CAPSULE | Freq: Three times a day (TID) | ORAL | 0 refills | Status: AC | PRN

## 2023-04-13 MED ORDER — AMOXICILLIN-POT CLAVULANATE 875-125 MG PO TABS: 1.0000 | ORAL_TABLET | Freq: Two times a day (BID) | ORAL | 0 refills | Status: AC

## 2023-04-13 MED ORDER — PREDNISONE 20 MG PO TABS: ORAL_TABLET | ORAL | 0 refills | Status: DC

## 2023-04-13 NOTE — ED Triage Notes (Signed)
 Pt c/o cough, congestion and wheezing since Monday. Denies fever. Hx of asthma and pneumonia. Taking allergy meds prn.

## 2023-04-13 NOTE — ED Provider Notes (Signed)
 [facility]    CSN: [id number] Arrival date & time: 04/13/23  7829      History   Chief Complaint Chief Complaint  Patient presents with   Cough    Appt 945A   Nasal Congestion   Wheezing    HPI Don Dominguez is a 58 y.o. male.   HPI Very pleasant 58 year old male presents with cough, sinus nasal congestion and wheezing for 6 days.  PMH significant for asthma and pneumonia.  Past Medical History:  Diagnosis Date   Asthma    Environmental allergies    Pneumonia due to 2019 novel coronavirus 01/2019    Patient Active Problem List   Diagnosis Date Noted   COVID-19 02/2019    Past Surgical History:  Procedure Laterality Date   HERNIA REPAIR     SPLENECTOMY, TOTAL     TONSILLECTOMY         Home Medications    Prior to Admission medications   Medication Sig Start Date End Date Taking? Authorizing Provider  amoxicillin-clavulanate (AUGMENTIN) 875-125 MG tablet Take 1 tablet by mouth 2 (two) times daily for 7 days. 04/13/23 04/20/23 Yes Trevor Iha, FNP  benzonatate (TESSALON) 200 MG capsule Take 1 capsule (200 mg total) by mouth 3 (three) times daily as needed for up to 7 days. 04/13/23 04/20/23 Yes Trevor Iha, FNP  predniSONE (DELTASONE) 20 MG tablet Take 3 tabs PO daily x 5 days. 04/13/23  Yes Trevor Iha, FNP  ALPRAZolam Prudy Feeler) 0.5 MG tablet Take 0.5 mg by mouth at bedtime as needed. 08/13/19   [provider]  atorvastatin (LIPITOR) 40 MG tablet Take 40 mg by mouth daily. 07/15/20   [provider]  cetirizine (ZYRTEC) 10 MG tablet Take 10 mg by mouth daily.    [provider]  fexofenadine (ALLEGRA) 180 MG tablet Take 180 mg by mouth daily.    [provider]  metoprolol succinate (TOPROL-XL) 25 MG 24 hr tablet Take 25 mg by mouth daily. 07/15/20   [provider]    Family History Family History  Problem Relation Age of Onset   Cancer Other        lung   Cancer Other        lung   Heart attack  Other    Diabetes Mother    Hypertension Mother    Heart attack Father     Social History Social History   Tobacco Use   Smoking status: Never   Smokeless tobacco: Never  Vaping Use   Vaping status: Never Used  Substance Use Topics   Alcohol use: No   Drug use: No     Allergies   Patient has no known allergies.   Review of Systems Review of Systems  Respiratory:  Positive for cough and wheezing.   All other systems reviewed and are negative.    Physical Exam Triage Vital Signs ED Triage Vitals  Encounter Vitals Group     BP 04/13/23 1018 (!) 145/77     Systolic BP Percentile --      Diastolic BP Percentile --      Pulse Rate 04/13/23 1018 (!) 59     Resp 04/13/23 1018 17     Temp 04/13/23 1018 98 F (36.7 C)     Temp Source 04/13/23 1018 Oral     SpO2 04/13/23 1018 99 %     Weight --      Height --      Head Circumference --  Peak Flow --      Pain Score 04/13/23 1019 0     Pain Loc --      Pain Education --      Exclude from Growth Chart --    No data found.  Updated Vital Signs BP (!) 145/77 (BP Location: Right Arm)   Pulse (!) 59   Temp 98 F (36.7 C) (Oral)   Resp 17   SpO2 99%    Physical Exam Vitals and nursing note reviewed.  Constitutional:      Appearance: Normal appearance. He is obese.  HENT:     Head: Normocephalic and atraumatic.     Right Ear: Tympanic membrane and external ear normal.     Left Ear: Tympanic membrane and external ear normal.     Ears:     Comments: Significant eustachian tube dysfunction noted bilaterally    Mouth/Throat:     Mouth: Mucous membranes are moist.     Pharynx: Oropharynx is clear.  Eyes:     Extraocular Movements: Extraocular movements intact.     Conjunctiva/sclera: Conjunctivae normal.     Pupils: Pupils are equal, round, and reactive to light.  Cardiovascular:     Rate and Rhythm: Normal rate and regular rhythm.     Pulses: Normal pulses.     Heart sounds: Normal heart sounds.   Pulmonary:     Effort: Pulmonary effort is normal.     Breath sounds: Normal breath sounds. No wheezing, rhonchi or rales.     Comments: Infrequent nonproductive cough noted on exam Musculoskeletal:        General: Normal range of motion.     Cervical back: Normal range of motion and neck supple.  Skin:    General: Skin is warm and dry.  Neurological:     General: No focal deficit present.     Mental Status: He is alert and oriented to person, place, and time. Mental status is at baseline.  Psychiatric:        Mood and Affect: Mood normal.        Behavior: Behavior normal.      UC Treatments / Results  Labs (all labs ordered are listed, but only abnormal results are displayed) Labs Reviewed - No data to display  EKG   Radiology No results found.  Procedures Procedures (including critical care time)  Medications Ordered in UC Medications - No data to display  Initial Impression / Assessment and Plan / UC Course  I have reviewed the triage vital signs and the nursing notes.  Pertinent labs & imaging results that were available during my care of the patient were reviewed by me and considered in my medical decision making (see chart for details).     MDM: 1.  Acute maxillary sinusitis, recurrence not specified-Rx'd Augmentin 875/125 mg tablet: Take 1 tablet twice daily x 7 days; 2.  Nasal sinus congestion-Rx'd prednisone 20 mg tablet: Take 3 tabs p.o. daily x 5 days; 3.  Cough, unspecified type-Rx'd Tessalon 200 mg capsules: Take 1 capsule 3 times daily, as needed for cough. Advised patient to take medications as directed with food to completion.  Advised patient to take prednisone with first dose of Augmentin for the next 5 of 7 days.  Advised may take Tessalon capsules daily or as needed for cough.  Encouraged increase daily water intake to 64 ounces per day while taking these medications.  Advised if symptoms worsen and/or unresolved please follow-up with your PCP or here  for further evaluation. Final Clinical Impressions(s) / UC Diagnoses   Final diagnoses:  Acute maxillary sinusitis, recurrence not specified  Nasal sinus congestion  Cough, unspecified type     Discharge Instructions      Advised patient to take medications as directed with food to completion.  Advised patient to take prednisone with first dose of Augmentin for the next 5 of 7 days.  Advised may take Tessalon capsules daily or as needed for cough.  Encouraged increase daily water intake to 64 ounces per day while taking these medications.  Advised if symptoms worsen and/or unresolved please follow-up with your PCP or here for further evaluation.     ED Prescriptions     Medication Sig Dispense Auth. Provider   amoxicillin-clavulanate (AUGMENTIN) 875-125 MG tablet Take 1 tablet by mouth 2 (two) times daily for 7 days. 14 tablet Trevor Iha, FNP   predniSONE (DELTASONE) 20 MG tablet Take 3 tabs PO daily x 5 days. 15 tablet Trevor Iha, FNP   benzonatate (TESSALON) 200 MG capsule Take 1 capsule (200 mg total) by mouth 3 (three) times daily as needed for up to 7 days. 40 capsule Trevor Iha, FNP      PDMP not reviewed this encounter.   Trevor Iha, FNP 04/13/23 1057

## 2023-04-13 NOTE — Discharge Instructions (Addendum)
 Advised patient to take medications as directed with food to completion.  Advised patient to take prednisone with first dose of Augmentin for the next 5 of 7 days.  Advised may take Tessalon capsules daily or as needed for cough.  Encouraged increase daily water intake to 64 ounces per day while taking these medications.  Advised if symptoms worsen and/or unresolved please follow-up with your PCP or here for further evaluation.

## 2023-12-20 ENCOUNTER — Telehealth: Payer: Self-pay

## 2023-12-20 ENCOUNTER — Ambulatory Visit
Admission: EM | Admit: 2023-12-20 | Discharge: 2023-12-20 | Disposition: A | Discharge status: Home / Self Care | Attending: Family Medicine | Admitting: Family Medicine

## 2023-12-20 ENCOUNTER — Ambulatory Visit: Payer: Self-pay

## 2023-12-20 DIAGNOSIS — R059 Cough, unspecified: Secondary | ICD-10-CM

## 2023-12-20 DIAGNOSIS — U071 COVID-19: Secondary | ICD-10-CM

## 2023-12-20 LAB — POC SOFIA SARS ANTIGEN FIA: SARS Coronavirus 2 Ag: POSITIVE — AB

## 2023-12-20 MED ORDER — HYDROCODONE BIT-HOMATROP MBR 5-1.5 MG/5ML PO SOLN: 5.0000 mL | Freq: Four times a day (QID) | ORAL | 0 refills | Status: AC | PRN

## 2023-12-20 MED ORDER — PAXLOVID (300/100) 20 X 150 MG & 10 X 100MG PO TBPK: 3.0000 | ORAL_TABLET | Freq: Two times a day (BID) | ORAL | 0 refills | Status: AC

## 2023-12-20 NOTE — Discharge Instructions (Addendum)
 Advised patient take medication as directed with food to completion. Advised may use Hycodan cough syrup at night for cough prior to sleep due to sedative effects.  Encouraged to increase daily water intake to 64 ounces per day while taking these medications.  Advised if symptoms worsen and/or unresolved please follow-up with your PCP or here for further evaluation.

## 2023-12-20 NOTE — ED Notes (Signed)
 [facility] called for STAT Pick up. Confirmation# [id number]

## 2023-12-20 NOTE — ED Provider Notes (Signed)
 [facility]    CSN: [id number] Arrival date & time: 12/20/23  1355      History   Chief Complaint Chief Complaint  Patient presents with   Cough   Nasal Congestion    HPI Don Dominguez is a 58 y.o. male.   HPI 58 year old male presents with low-grade fever, body aches, cough, and congestion since last night.  PMH significant for CAD, aortic root aneurysm, HLD, and T2DM.  Past Medical History:  Diagnosis Date   Asthma    Environmental allergies    Pneumonia due to 2019 novel coronavirus 01/2019    Patient Active Problem List   Diagnosis Date Noted   COVID-19 02/2019    Past Surgical History:  Procedure Laterality Date   HERNIA REPAIR     SPLENECTOMY, TOTAL     TONSILLECTOMY         Home Medications    Prior to Admission medications   Medication Sig Start Date End Date Taking? Authorizing Provider  HYDROcodone bit-homatropine (HYCODAN) 5-1.5 MG/5ML syrup Take 5 mLs by mouth every 6 (six) hours as needed for cough. 12/20/23  Yes Teddy Sharper, FNP  nirmatrelvir/ritonavir (PAXLOVID, 300/100,) 20 x 150 MG & 10 x 100MG  TBPK Take 3 tablets by mouth 2 (two) times daily for 5 days. Patient GFR is 101. Take nirmatrelvir (150 mg) two tablets twice daily for 5 days and ritonavir (100 mg) one tablet twice daily for 5 days. 12/20/23 12/25/23 Yes Teddy Sharper, FNP  ALPRAZolam (XANAX) 0.5 MG tablet Take 0.5 mg by mouth at bedtime as needed. 08/13/19   [provider]  atorvastatin (LIPITOR) 40 MG tablet Take 40 mg by mouth daily. 07/15/20   [provider]  cetirizine (ZYRTEC) 10 MG tablet Take 10 mg by mouth daily.    [provider]  fexofenadine (ALLEGRA) 180 MG tablet Take 180 mg by mouth daily.    [provider]  metoprolol succinate (TOPROL-XL) 25 MG 24 hr tablet Take 25 mg by mouth daily. 07/15/20   [provider]    Family History Family History  Problem Relation Age of Onset   Cancer Other        lung    Cancer Other        lung   Heart attack Other    Diabetes Mother    Hypertension Mother    Heart attack Father     Social History Social History   Tobacco Use   Smoking status: Never   Smokeless tobacco: Never  Vaping Use   Vaping status: Never Used  Substance Use Topics   Alcohol use: No   Drug use: No     Allergies   Patient has no known allergies.   Review of Systems Review of Systems  Constitutional:  Positive for fever.  HENT:  Positive for congestion.   Respiratory:  Positive for cough.   Musculoskeletal:  Positive for arthralgias and myalgias.  All other systems reviewed and are negative.    Physical Exam Triage Vital Signs ED Triage Vitals  Encounter Vitals Group     BP      Girls Systolic BP Percentile      Girls Diastolic BP Percentile      Boys Systolic BP Percentile      Boys Diastolic BP Percentile      Pulse      Resp      Temp      Temp src      SpO2  Weight      Height      Head Circumference      Peak Flow      Pain Score      Pain Loc      Pain Education      Exclude from Growth Chart    No data found.  Updated Vital Signs BP 107/66 (BP Location: Right Arm)   Pulse 73   Temp 98.3 F (36.8 C) (Oral)   Resp 17   SpO2 98%    Physical Exam Vitals and nursing note reviewed.  Constitutional:      Appearance: Normal appearance. He is normal weight. He is ill-appearing.  HENT:     Head: Normocephalic and atraumatic.     Right Ear: Tympanic membrane, ear canal and external ear normal.     Left Ear: Tympanic membrane, ear canal and external ear normal.     Mouth/Throat:     Mouth: Mucous membranes are moist.     Pharynx: Oropharynx is clear.  Eyes:     Extraocular Movements: Extraocular movements intact.     Conjunctiva/sclera: Conjunctivae normal.     Pupils: Pupils are equal, round, and reactive to light.  Cardiovascular:     Rate and Rhythm: Normal rate and regular rhythm.     Heart sounds: Normal heart sounds.  No murmur heard. Pulmonary:     Effort: Pulmonary effort is normal.     Breath sounds: Normal breath sounds. No wheezing, rhonchi or rales.     Comments: Infrequent nonproductive cough on exam Musculoskeletal:        General: Normal range of motion.  Skin:    General: Skin is warm and dry.  Neurological:     General: No focal deficit present.     Mental Status: He is alert and oriented to person, place, and time. Mental status is at baseline.  Psychiatric:        Mood and Affect: Mood normal.        Behavior: Behavior normal.      UC Treatments / Results  Labs (all labs ordered are listed, but only abnormal results are displayed) Labs Reviewed  POC SOFIA SARS ANTIGEN FIA - Abnormal; Notable for the following components:      Result Value   SARS Coronavirus 2 Ag Positive (*)    All other components within normal limits  COMPREHENSIVE METABOLIC PANEL WITH GFR    EKG   Radiology No results found.  Procedures Procedures (including critical care time)  Medications Ordered in UC Medications - No data to display  Initial Impression / Assessment and Plan / UC Course  I have reviewed the triage vital signs and the nursing notes.  Pertinent labs & imaging results that were available during my care of the patient were reviewed by me and considered in my medical decision making (see chart for details).     MDM: 1.  COVID-19-Rx'd Paxlovid: Take 3 tablets p.o. twice daily x 5 days; 2.  Cough, unspecified type-Rx'd Hycodan 5-1.5 mg / 5 mL syrup: Take 5 mL every 6 hours as needed for cough. Advised patient take medication as directed with food to completion. Advised may use Hycodan cough syrup at night for cough prior to sleep due to sedative effects.  Encouraged to increase daily water intake to 64 ounces per day while taking these medications.  Advised if symptoms worsen and/or unresolved please follow-up with your PCP or here for further evaluation.  Patient discharged home,  hemodynamically stable.  Final Clinical Impressions(s) / UC Diagnoses   Final diagnoses:  COVID-19  Cough, unspecified type     Discharge Instructions      Advised patient take medication as directed with food to completion. Advised may use Hycodan cough syrup at night for cough prior to sleep due to sedative effects.  Encouraged to increase daily water intake to 64 ounces per day while taking these medications.  Advised if symptoms worsen and/or unresolved please follow-up with your PCP or here for further evaluation.     ED Prescriptions     Medication Sig Dispense Auth. Provider   nirmatrelvir/ritonavir (PAXLOVID, 300/100,) 20 x 150 MG & 10 x 100MG  TBPK Take 3 tablets by mouth 2 (two) times daily for 5 days. Patient GFR is 101. Take nirmatrelvir (150 mg) two tablets twice daily for 5 days and ritonavir (100 mg) one tablet twice daily for 5 days. 30 tablet Leaann Nevils, FNP   HYDROcodone bit-homatropine (HYCODAN) 5-1.5 MG/5ML syrup Take 5 mLs by mouth every 6 (six) hours as needed for cough. 120 mL Teddy Sharper, FNP      I have reviewed the PDMP during this encounter.   Teddy Sharper, FNP 12/20/23 [phone]

## 2023-12-20 NOTE — ED Triage Notes (Signed)
 Pt c/o cough and congestion since last night. Low grade fever and bodyaches. Taking tylenol prn.

## 2023-12-21 ENCOUNTER — Telehealth: Payer: Self-pay

## 2023-12-21 LAB — COMPREHENSIVE METABOLIC PANEL WITH GFR
ALT: 22 IU/L (ref 0–44)
AST: 26 IU/L (ref 0–40)
Albumin: 4.3 g/dL (ref 3.8–4.9)
Alkaline Phosphatase: 72 IU/L (ref 47–123)
BUN/Creatinine Ratio: 13 (ref 10–24)
BUN: 11 mg/dL (ref 8–27)
Bilirubin Total: 0.4 mg/dL (ref 0.0–1.2)
CO2: 23 mmol/L (ref 20–29)
Calcium: 9 mg/dL (ref 8.6–10.2)
Chloride: 97 mmol/L (ref 96–106)
Creatinine, Ser: 0.83 mg/dL (ref 0.76–1.27)
Globulin, Total: 2.1 g/dL (ref 1.5–4.5)
Glucose: 95 mg/dL (ref 70–99)
Potassium: 4.4 mmol/L (ref 3.5–5.2)
Sodium: 133 mmol/L — ABNORMAL LOW (ref 134–144)
Total Protein: 6.4 g/dL (ref 6.0–8.5)
eGFR: 100 mL/min/1.73 (ref >59)

## 2023-12-21 NOTE — Telephone Encounter (Signed)
 Call made to pt to check on status since UC visit. Per Don Major, FNP, labs normal for kidney function. May start paxlovid today. Please call if any questions or concerns. Pt verbalized understanding.

## 2023-12-23 ENCOUNTER — Ambulatory Visit (HOSPITAL_COMMUNITY): Payer: Self-pay
